# Patient Record
Sex: Female | Born: 1960 | Race: White | Hispanic: No | Marital: Married | Smoking: Former smoker
Health system: Southern US, Community
[De-identification: ages and names within clinical notes are randomized; demographics above are authoritative.]

## PROBLEM LIST (undated history)

## (undated) DIAGNOSIS — K5790 Diverticulosis of intestine, part unspecified, without perforation or abscess without bleeding: Secondary | ICD-10-CM

## (undated) DIAGNOSIS — I10 Essential (primary) hypertension: Secondary | ICD-10-CM

## (undated) DIAGNOSIS — C801 Malignant (primary) neoplasm, unspecified: Secondary | ICD-10-CM

## (undated) DIAGNOSIS — I714 Abdominal aortic aneurysm, without rupture, unspecified: Secondary | ICD-10-CM

## (undated) DIAGNOSIS — E785 Hyperlipidemia, unspecified: Secondary | ICD-10-CM

## (undated) DIAGNOSIS — M1990 Unspecified osteoarthritis, unspecified site: Secondary | ICD-10-CM

## (undated) DIAGNOSIS — R112 Nausea with vomiting, unspecified: Secondary | ICD-10-CM

## (undated) HISTORY — PX: APPENDECTOMY: SHX54

## (undated) HISTORY — DX: Abdominal aortic aneurysm, without rupture, unspecified: I71.40

## (undated) HISTORY — DX: Unspecified osteoarthritis, unspecified site: M19.90

## (undated) HISTORY — DX: Essential (primary) hypertension: I10

## (undated) HISTORY — DX: Hyperlipidemia, unspecified: E78.5

## (undated) HISTORY — PX: TONSILLECTOMY: SHX5217

## (undated) HISTORY — DX: Diverticulosis of intestine, part unspecified, without perforation or abscess without bleeding: K57.90

## (undated) HISTORY — PX: NECK SURGERY: SHX720

## (undated) HISTORY — PX: MOHS SURGERY: SHX181

---

## 1993-06-08 HISTORY — PX: ECTOPIC PREGNANCY SURGERY: SHX613

## 2005-04-03 ENCOUNTER — Ambulatory Visit: Payer: Self-pay | Admitting: Family Medicine

## 2005-08-18 ENCOUNTER — Observation Stay (HOSPITAL_COMMUNITY): Admission: RE | Admit: 2005-08-18 | Discharge: 2005-08-19 | Payer: Self-pay | Admitting: Neurosurgery

## 2006-04-22 ENCOUNTER — Other Ambulatory Visit: Payer: Self-pay

## 2006-04-22 ENCOUNTER — Emergency Department: Payer: Self-pay | Admitting: Emergency Medicine

## 2006-05-06 ENCOUNTER — Ambulatory Visit: Payer: Self-pay | Admitting: Cardiovascular Disease

## 2006-05-20 DIAGNOSIS — I10 Essential (primary) hypertension: Secondary | ICD-10-CM | POA: Insufficient documentation

## 2006-05-24 ENCOUNTER — Ambulatory Visit: Payer: Self-pay | Admitting: Gastroenterology

## 2006-06-12 ENCOUNTER — Ambulatory Visit: Payer: Self-pay | Admitting: Gastroenterology

## 2006-07-22 ENCOUNTER — Ambulatory Visit: Payer: Self-pay | Admitting: Gastroenterology

## 2007-02-15 ENCOUNTER — Ambulatory Visit: Payer: Self-pay | Admitting: Family Medicine

## 2007-02-16 ENCOUNTER — Ambulatory Visit: Payer: Self-pay | Admitting: Gastroenterology

## 2007-11-04 ENCOUNTER — Ambulatory Visit: Payer: Self-pay | Admitting: Gastroenterology

## 2008-04-28 ENCOUNTER — Ambulatory Visit: Payer: Self-pay | Admitting: Family Medicine

## 2009-02-06 ENCOUNTER — Ambulatory Visit: Payer: Self-pay | Admitting: Internal Medicine

## 2009-07-31 ENCOUNTER — Ambulatory Visit: Payer: Self-pay | Admitting: Family Medicine

## 2010-05-02 ENCOUNTER — Ambulatory Visit: Payer: Self-pay | Admitting: Internal Medicine

## 2010-07-16 ENCOUNTER — Ambulatory Visit: Payer: Self-pay | Admitting: Family Medicine

## 2011-04-15 DIAGNOSIS — D1800 Hemangioma unspecified site: Secondary | ICD-10-CM | POA: Insufficient documentation

## 2011-06-05 ENCOUNTER — Ambulatory Visit: Payer: Self-pay | Admitting: Internal Medicine

## 2011-06-19 ENCOUNTER — Ambulatory Visit: Payer: Self-pay | Admitting: Otolaryngology

## 2012-03-28 ENCOUNTER — Ambulatory Visit: Payer: Self-pay | Admitting: Internal Medicine

## 2012-04-11 ENCOUNTER — Other Ambulatory Visit: Payer: Self-pay | Admitting: Internal Medicine

## 2012-04-13 ENCOUNTER — Ambulatory Visit: Payer: Self-pay | Admitting: Internal Medicine

## 2012-11-14 ENCOUNTER — Ambulatory Visit: Payer: Self-pay | Admitting: Internal Medicine

## 2012-11-22 ENCOUNTER — Ambulatory Visit: Payer: Self-pay | Admitting: Orthopedic Surgery

## 2012-11-24 ENCOUNTER — Ambulatory Visit: Payer: Self-pay | Admitting: Orthopedic Surgery

## 2012-11-24 LAB — APTT: Activated PTT: 48.9 secs — ABNORMAL HIGH (ref 23.6–35.9)

## 2012-11-24 LAB — PLATELET COUNT: Platelet: 211 10*3/uL (ref 150–440)

## 2012-12-12 ENCOUNTER — Ambulatory Visit: Payer: Self-pay | Admitting: Orthopedic Surgery

## 2012-12-12 LAB — PLATELET COUNT: Platelet: 216 10*3/uL (ref 150–440)

## 2012-12-12 LAB — PROTIME-INR: Prothrombin Time: 12.8 secs (ref 11.5–14.7)

## 2013-01-10 ENCOUNTER — Encounter: Payer: Self-pay | Admitting: Orthopedic Surgery

## 2013-02-06 ENCOUNTER — Encounter: Payer: Self-pay | Admitting: Orthopedic Surgery

## 2013-05-16 ENCOUNTER — Ambulatory Visit: Payer: Self-pay | Admitting: Internal Medicine

## 2014-12-27 ENCOUNTER — Other Ambulatory Visit: Payer: Self-pay | Admitting: Family Medicine

## 2015-02-14 ENCOUNTER — Other Ambulatory Visit: Payer: Self-pay | Admitting: Family Medicine

## 2015-02-14 NOTE — Telephone Encounter (Signed)
Pt needs check further refills 

## 2015-02-25 NOTE — Telephone Encounter (Signed)
Called home # has been disconnected, called work # left voicemail to return call and schedule an appointment. Thanks.

## 2015-03-21 ENCOUNTER — Other Ambulatory Visit: Payer: Self-pay | Admitting: Family Medicine

## 2015-04-28 ENCOUNTER — Other Ambulatory Visit: Payer: Self-pay | Admitting: Family Medicine

## 2015-04-29 NOTE — Telephone Encounter (Signed)
Apt med  check

## 2015-05-06 NOTE — Telephone Encounter (Signed)
Lm for pt to call and schedule appt.

## 2015-05-06 NOTE — Telephone Encounter (Signed)
Appt 05/15/15

## 2015-05-15 ENCOUNTER — Ambulatory Visit: Payer: Self-pay | Admitting: Family Medicine

## 2015-06-22 ENCOUNTER — Other Ambulatory Visit: Payer: Self-pay | Admitting: Family Medicine

## 2015-06-22 NOTE — Telephone Encounter (Signed)
Old chart reviewed; she's been on this since Sept 2015; refill approved

## 2015-08-21 ENCOUNTER — Other Ambulatory Visit: Payer: Self-pay | Admitting: Family Medicine

## 2015-09-19 ENCOUNTER — Other Ambulatory Visit: Payer: Self-pay | Admitting: Family Medicine

## 2015-09-19 ENCOUNTER — Encounter: Payer: Self-pay | Admitting: Family Medicine

## 2015-09-19 NOTE — Telephone Encounter (Signed)
Apt pe 

## 2015-09-19 NOTE — Telephone Encounter (Signed)
Letter sent.

## 2015-10-20 ENCOUNTER — Other Ambulatory Visit: Payer: Self-pay | Admitting: Family Medicine

## 2016-01-14 ENCOUNTER — Other Ambulatory Visit: Payer: Self-pay | Admitting: Family Medicine

## 2016-06-16 ENCOUNTER — Other Ambulatory Visit: Payer: Self-pay

## 2016-06-16 MED ORDER — MELOXICAM 15 MG PO TABS: ORAL_TABLET | ORAL | 1 refills | Status: DC

## 2016-06-16 NOTE — Telephone Encounter (Signed)
Pharmacy requests 90 day supply

## 2016-12-12 ENCOUNTER — Other Ambulatory Visit: Payer: Self-pay | Admitting: Family Medicine

## 2018-06-15 ENCOUNTER — Other Ambulatory Visit: Payer: Self-pay | Admitting: Family Medicine

## 2018-06-15 DIAGNOSIS — Z1231 Encounter for screening mammogram for malignant neoplasm of breast: Secondary | ICD-10-CM

## 2018-06-16 ENCOUNTER — Other Ambulatory Visit: Payer: Self-pay

## 2018-06-16 DIAGNOSIS — Z1211 Encounter for screening for malignant neoplasm of colon: Secondary | ICD-10-CM

## 2018-06-20 ENCOUNTER — Ambulatory Visit
Admission: RE | Admit: 2018-06-20 | Discharge: 2018-06-20 | Disposition: A | Discharge status: Home / Self Care | Payer: 59 | Source: Ambulatory Visit | Attending: Family Medicine | Admitting: Family Medicine

## 2018-06-20 ENCOUNTER — Encounter: Payer: Self-pay | Admitting: Radiology

## 2018-06-20 DIAGNOSIS — Z1231 Encounter for screening mammogram for malignant neoplasm of breast: Secondary | ICD-10-CM | POA: Diagnosis not present

## 2018-06-20 HISTORY — DX: Malignant (primary) neoplasm, unspecified: C80.1

## 2018-06-24 ENCOUNTER — Telehealth: Payer: Self-pay | Admitting: Gastroenterology

## 2018-06-24 NOTE — Telephone Encounter (Signed)
Patient contacted office stated its too soon for her to have her colonoscopy.  She said she will contact her PCP to let her know.  Referral has been canceled and procedure canceled with Trish in Endoscopy.  Thanks [employer]

## 2018-06-24 NOTE — Telephone Encounter (Signed)
Patient called & would like to cancel her colonoscopy scheduled for 07-26-2018 with Dr Allen Norris. Her primary care referred her. She had her last colonoscopy done by Dr Allen Norris on 06-27-10. She does not think she needs one this soon. She will also tell her PCP.

## 2018-07-26 ENCOUNTER — Ambulatory Visit: Admit: 2018-07-26 | Payer: Self-pay | Admitting: Gastroenterology

## 2018-07-26 SURGERY — COLONOSCOPY WITH PROPOFOL
Anesthesia: General

## 2019-10-07 DEATH — deceased

## 2020-09-13 DIAGNOSIS — L570 Actinic keratosis: Secondary | ICD-10-CM | POA: Insufficient documentation

## 2020-09-13 DIAGNOSIS — R3129 Other microscopic hematuria: Secondary | ICD-10-CM | POA: Insufficient documentation

## 2020-10-16 DIAGNOSIS — E785 Hyperlipidemia, unspecified: Secondary | ICD-10-CM | POA: Insufficient documentation

## 2020-10-16 DIAGNOSIS — M948X9 Other specified disorders of cartilage, unspecified sites: Secondary | ICD-10-CM | POA: Insufficient documentation

## 2020-10-16 DIAGNOSIS — M898X9 Other specified disorders of bone, unspecified site: Secondary | ICD-10-CM | POA: Insufficient documentation

## 2021-08-08 DIAGNOSIS — E039 Hypothyroidism, unspecified: Secondary | ICD-10-CM | POA: Diagnosis not present

## 2021-08-08 DIAGNOSIS — Z1211 Encounter for screening for malignant neoplasm of colon: Secondary | ICD-10-CM | POA: Diagnosis not present

## 2021-08-08 DIAGNOSIS — R7303 Prediabetes: Secondary | ICD-10-CM | POA: Diagnosis not present

## 2021-08-08 DIAGNOSIS — D519 Vitamin B12 deficiency anemia, unspecified: Secondary | ICD-10-CM | POA: Diagnosis not present

## 2021-08-08 DIAGNOSIS — Z1231 Encounter for screening mammogram for malignant neoplasm of breast: Secondary | ICD-10-CM | POA: Diagnosis not present

## 2021-08-08 DIAGNOSIS — E782 Mixed hyperlipidemia: Secondary | ICD-10-CM | POA: Diagnosis not present

## 2021-08-08 DIAGNOSIS — I10 Essential (primary) hypertension: Secondary | ICD-10-CM | POA: Diagnosis not present

## 2021-08-08 DIAGNOSIS — E559 Vitamin D deficiency, unspecified: Secondary | ICD-10-CM | POA: Diagnosis not present

## 2021-08-09 DIAGNOSIS — H43819 Vitreous degeneration, unspecified eye: Secondary | ICD-10-CM | POA: Insufficient documentation

## 2021-08-11 ENCOUNTER — Other Ambulatory Visit: Payer: Self-pay | Admitting: Family

## 2021-08-11 DIAGNOSIS — E782 Mixed hyperlipidemia: Secondary | ICD-10-CM | POA: Diagnosis not present

## 2021-08-11 DIAGNOSIS — E039 Hypothyroidism, unspecified: Secondary | ICD-10-CM | POA: Diagnosis not present

## 2021-08-11 DIAGNOSIS — R7303 Prediabetes: Secondary | ICD-10-CM | POA: Diagnosis not present

## 2021-08-11 DIAGNOSIS — E559 Vitamin D deficiency, unspecified: Secondary | ICD-10-CM | POA: Diagnosis not present

## 2021-08-11 DIAGNOSIS — I10 Essential (primary) hypertension: Secondary | ICD-10-CM | POA: Diagnosis not present

## 2021-08-11 DIAGNOSIS — D519 Vitamin B12 deficiency anemia, unspecified: Secondary | ICD-10-CM | POA: Diagnosis not present

## 2021-08-11 DIAGNOSIS — Z1231 Encounter for screening mammogram for malignant neoplasm of breast: Secondary | ICD-10-CM

## 2021-08-13 ENCOUNTER — Other Ambulatory Visit: Payer: Self-pay

## 2021-08-13 DIAGNOSIS — Z1211 Encounter for screening for malignant neoplasm of colon: Secondary | ICD-10-CM

## 2021-08-13 MED ORDER — CLENPIQ 10-3.5-12 MG-GM -GM/160ML PO SOLN: 1.0000 | Freq: Once | ORAL | 0 refills | Status: AC

## 2021-08-13 NOTE — Progress Notes (Unsigned)
0

## 2021-08-13 NOTE — Progress Notes (Signed)
Gastroenterology Pre-Procedure Review ? ?Request Date: 09/01/2021 ?Requesting Physician: Dr. Marius Ditch ? ?PATIENT REVIEW QUESTIONS: The patient responded to the following health history questions as indicated:   ? ?1. Are you having any GI issues? no ?2. Do you have a personal history of Polyps? no ?3. Do you have a family history of Colon Cancer or Polyps? no ?4. Diabetes Mellitus? no ?5. Joint replacements in the past 12 months?no ?6. Major health problems in the past 3 months?no ?7. Any artificial heart valves, MVP, or defibrillator?no ?   ?MEDICATIONS & ALLERGIES:    ?Patient reports the following regarding taking any anticoagulation/antiplatelet therapy:   ?Plavix, Coumadin, Eliquis, Xarelto, Lovenox, Pradaxa, Brilinta, or Effient? no ?Aspirin? no ? ?Patient confirms/reports the following medications:  ?Current Outpatient Medications  ?Medication Sig Dispense Refill  ? amLODipine (NORVASC) 5 MG tablet TAKE 1 TABLET BY MOUTH DAILY 90 tablet 1  ? benazepril (LOTENSIN) 40 MG tablet TAKE 1 TABLET BY MOUTH DAILY 90 tablet 1  ? cloNIDine (CATAPRES) 0.1 MG tablet Take 1 tablet (0.1 mg total) by mouth 2 (two) times daily. 180 tablet 0  ? hydrochlorothiazide (HYDRODIURIL) 25 MG tablet TAKE 1 TABLET BY MOUTH DAILY 90 tablet 1  ? meloxicam (MOBIC) 15 MG tablet TAKE 1 TABLET BY MOUTH EVERY DAY AS NEEDED FOR PAIN 90 tablet 1  ? metoprolol succinate (TOPROL-XL) 50 MG 24 hr tablet TAKE 1 TABLET BY MOUTH DAILY 90 tablet 0  ? potassium chloride (MICRO-K) 10 MEQ CR capsule TAKE ONE CAPSULE BY MOUTH DAILY 5 capsule 0  ? ?No current facility-administered medications for this visit.  ? ? ?Patient confirms/reports the following allergies:  ?Allergies  ?Allergen Reactions  ? Penicillins   ? Prochlorperazine   ? ? ?No orders of the defined types were placed in this encounter. ? ? ?AUTHORIZATION INFORMATION ?Primary Insurance: ?1D#: ?Group #: ? ?Secondary Insurance: ?1D#: ?Group #: ? ?SCHEDULE INFORMATION: ?Date:  09/01/2021 ?Time: ?Location: [facility] ? ?

## 2021-09-01 ENCOUNTER — Ambulatory Visit: Payer: 59 | Admitting: Certified Registered Nurse Anesthetist

## 2021-09-01 ENCOUNTER — Ambulatory Visit
Admission: RE | Admit: 2021-09-01 | Discharge: 2021-09-01 | Disposition: A | Discharge status: Home / Self Care | Payer: 59 | Attending: Gastroenterology | Admitting: Gastroenterology

## 2021-09-01 ENCOUNTER — Encounter: Payer: Self-pay | Admitting: Gastroenterology

## 2021-09-01 ENCOUNTER — Encounter
Admission: RE | Disposition: A | Discharge status: Home / Self Care | Payer: Self-pay | Source: Home / Self Care | Attending: Gastroenterology

## 2021-09-01 DIAGNOSIS — K5730 Diverticulosis of large intestine without perforation or abscess without bleeding: Secondary | ICD-10-CM | POA: Diagnosis not present

## 2021-09-01 DIAGNOSIS — D123 Benign neoplasm of transverse colon: Secondary | ICD-10-CM | POA: Insufficient documentation

## 2021-09-01 DIAGNOSIS — D128 Benign neoplasm of rectum: Secondary | ICD-10-CM | POA: Diagnosis not present

## 2021-09-01 DIAGNOSIS — K649 Unspecified hemorrhoids: Secondary | ICD-10-CM | POA: Diagnosis not present

## 2021-09-01 DIAGNOSIS — K621 Rectal polyp: Secondary | ICD-10-CM | POA: Diagnosis not present

## 2021-09-01 DIAGNOSIS — Z1211 Encounter for screening for malignant neoplasm of colon: Secondary | ICD-10-CM | POA: Diagnosis not present

## 2021-09-01 DIAGNOSIS — K644 Residual hemorrhoidal skin tags: Secondary | ICD-10-CM | POA: Insufficient documentation

## 2021-09-01 DIAGNOSIS — D126 Benign neoplasm of colon, unspecified: Secondary | ICD-10-CM | POA: Diagnosis not present

## 2021-09-01 DIAGNOSIS — K635 Polyp of colon: Secondary | ICD-10-CM

## 2021-09-01 DIAGNOSIS — D120 Benign neoplasm of cecum: Secondary | ICD-10-CM | POA: Diagnosis not present

## 2021-09-01 HISTORY — PX: COLONOSCOPY WITH PROPOFOL: SHX5780

## 2021-09-01 SURGERY — COLONOSCOPY WITH PROPOFOL
Anesthesia: General

## 2021-09-01 MED ORDER — PROPOFOL 10 MG/ML IV BOLUS
INTRAVENOUS | Status: DC | PRN
  Administered 2021-09-01: 80 mg via INTRAVENOUS

## 2021-09-01 MED ORDER — PROPOFOL 500 MG/50ML IV EMUL
INTRAVENOUS | Status: AC
  Filled 2021-09-01: qty 50

## 2021-09-01 MED ORDER — SODIUM CHLORIDE 0.9 % IV SOLN
INTRAVENOUS | Status: DC
  Administered 2021-09-01: 20 mL/h via INTRAVENOUS

## 2021-09-01 MED ORDER — PROPOFOL 500 MG/50ML IV EMUL
INTRAVENOUS | Status: DC | PRN
Start: 2021-09-01 — End: 2021-09-01
  Administered 2021-09-01: 150 ug/kg/min via INTRAVENOUS

## 2021-09-01 MED ORDER — LIDOCAINE HCL (PF) 2 % IJ SOLN
INTRAMUSCULAR | Status: AC
  Filled 2021-09-01: qty 5

## 2021-09-01 MED ORDER — SODIUM CHLORIDE 0.9 % IV SOLN
INTRAVENOUS | Status: AC | PRN
Start: 2021-09-01 — End: 2021-09-01
  Administered 2021-09-01: 3 mL via INTRAMUSCULAR

## 2021-09-01 MED ORDER — GLYCOPYRROLATE 0.2 MG/ML IJ SOLN
INTRAMUSCULAR | Status: AC
  Filled 2021-09-01: qty 1

## 2021-09-01 MED ORDER — LIDOCAINE HCL (CARDIAC) PF 100 MG/5ML IV SOSY
PREFILLED_SYRINGE | INTRAVENOUS | Status: DC | PRN
  Administered 2021-09-01: 50 mg via INTRAVENOUS

## 2021-09-01 NOTE — Anesthesia Postprocedure Evaluation (Signed)
Anesthesia Post Note ? ?Patient: Laura Mcdowell ? ?Procedure(s) Performed: COLONOSCOPY WITH PROPOFOL ? ?Patient location during evaluation: Endoscopy ?Anesthesia Type: General ?Level of consciousness: awake and alert ?Pain management: pain level controlled ?Vital Signs Assessment: post-procedure vital signs reviewed and stable ?Respiratory status: spontaneous breathing, nonlabored ventilation, respiratory function stable and patient connected to nasal cannula oxygen ?Cardiovascular status: blood pressure returned to baseline and stable ?Postop Assessment: no apparent nausea or vomiting ?Anesthetic complications: no ? ? ?No notable events documented. ? ? ?Last Vitals:  ?Vitals:  ? 09/01/21 0901 09/01/21 0910  ?BP: (!) 166/99 (!) 163/95  ?Pulse: 68 65  ?Resp: 12 12  ?Temp:    ?SpO2: 99% 96%  ?  ?Last Pain:  ?Vitals:  ? 09/01/21 0910  ?TempSrc:   ?PainSc: 0-No pain  ? ? ?  ?  ?  ?  ?  ?  ? ?Martha Clan ? ? ? ? ?

## 2021-09-01 NOTE — Transfer of Care (Signed)
Immediate Anesthesia Transfer of Care Note ? ?Patient: Laura Mcdowell ? ?Procedure(s) Performed: COLONOSCOPY WITH PROPOFOL ? ?Patient Location: PACU ? ?Anesthesia Type:General ? ?Level of Consciousness: awake, alert  and oriented ? ?Airway & Oxygen Therapy: Patient Spontanous Breathing ? ?Post-op Assessment: Report given to RN and Post -op Vital signs reviewed and stable ? ?Post vital signs: Reviewed and stable ? ?Last Vitals:  ?Vitals Value Taken Time  ?BP 129/83 09/01/21 0842  ?Temp    ?Pulse 79 09/01/21 0842  ?Resp 19 09/01/21 0842  ?SpO2 100 % 09/01/21 0842  ?Vitals shown include unvalidated device data. ? ?Last Pain:  ?Vitals:  ? 09/01/21 0646  ?TempSrc: Temporal  ?PainSc: 0-No pain  ?   ? ?  ? ?Complications: No notable events documented. ?

## 2021-09-01 NOTE — Anesthesia Procedure Notes (Signed)
Date/Time: 09/01/2021 7:57 AM ?Performed by: Johnna Acosta, CRNA ?Pre-anesthesia Checklist: Patient identified, Emergency Drugs available, Patient being monitored, Suction available and Timeout performed ?Patient Re-evaluated:Patient Re-evaluated prior to induction ?Oxygen Delivery Method: Nasal cannula ?Preoxygenation: Pre-oxygenation with 100% oxygen ?Induction Type: IV induction ? ? ? ? ?

## 2021-09-01 NOTE — Op Note (Signed)
[facility] ?Gastroenterology ?Patient Name: Laura Mcdowell ?Procedure Date: 09/01/2021 7:39 AM ?MRN: [id number] ?Account #: [id number] ?Date of Birth: 23-Oct-1960 ?Admit Type: Outpatient ?Age: 61 ?Room: [facility] ENDO ROOM 1 ?Gender: Female ?Note Status: Finalized ?Instrument Name: Peds Colonoscope [id number] ?Procedure:             Colonoscopy ?Indications:           Screening for colorectal malignant neoplasm, Last  ?                       colonoscopy 10 years ago ?Providers:             Lin Landsman MD, MD ?Referring MD:          [facility] ?Medicines:             General Anesthesia ?Complications:         No immediate complications. Estimated blood loss: None. ?Procedure:             Pre-Anesthesia Assessment: ?                       - Prior to the procedure, a History and Physical was  ?                       performed, and patient medications and allergies were  ?                       reviewed. The patient is competent. The risks and  ?                       benefits of the procedure and the sedation options and  ?                       risks were discussed with the patient. All questions  ?                       were answered and informed consent was obtained.  ?                       Patient identification and proposed procedure were  ?                       verified by the physician, the nurse, the  ?                       anesthesiologist, the anesthetist and the technician  ?                       in the pre-procedure area in the procedure room in the  ?                       endoscopy suite. Mental Status Examination: alert and  ?                       oriented. Airway Examination: normal oropharyngeal  ?                       airway and neck mobility. Respiratory Examination:  ?  clear to auscultation. CV Examination: normal.  ?                       Prophylactic Antibiotics: The patient does not require  ?                       prophylactic  antibiotics. Prior Anticoagulants: The  ?                       patient has taken no previous anticoagulant or  ?                       antiplatelet agents. ASA Grade Assessment: II - A  ?                       patient with mild systemic disease. After reviewing  ?                       the risks and benefits, the patient was deemed in  ?                       satisfactory condition to undergo the procedure. The  ?                       anesthesia plan was to use general anesthesia.  ?                       Immediately prior to administration of medications,  ?                       the patient was re-assessed for adequacy to receive  ?                       sedatives. The heart rate, respiratory rate, oxygen  ?                       saturations, blood pressure, adequacy of pulmonary  ?                       ventilation, and response to care were monitored  ?                       throughout the procedure. The physical status of the  ?                       patient was re-assessed after the procedure. ?                       After obtaining informed consent, the colonoscope was  ?                       passed under direct vision. Throughout the procedure,  ?                       the patient's blood pressure, pulse, and oxygen  ?                       saturations were monitored continuously. The  ?  Colonoscope was introduced through the anus and  ?                       advanced to the the terminal ileum, with  ?                       identification of the appendiceal orifice and IC  ?                       valve. The colonoscopy was performed without  ?                       difficulty. The patient tolerated the procedure well.  ?                       The quality of the bowel preparation was evaluated  ?                       using the BBPS [facility] Bowel Preparation Scale) with  ?                       scores of: Right Colon = 3, Transverse Colon = 3 and  ?                       Left Colon =  3 (entire mucosa seen well with no  ?                       residual staining, small fragments of stool or opaque  ?                       liquid). The total BBPS score equals 9. ?Findings: ?     The perianal and digital rectal examinations were normal. Pertinent  ?     negatives include normal sphincter tone and no palpable rectal lesions. ?     The terminal ileum appeared normal. ?     A 10 mm polyp was found in the cecum. The polyp was flat. Preparations  ?     were made for mucosal resection. NBI was done to mark the borders of the  ?     lesion. Saline was injected to raise the lesion. Snare mucosal resection  ?     was performed. Resection and retrieval were complete. To prevent  ?     bleeding after mucosal resection, one hemostatic clip was successfully  ?     placed (MR conditional). There was no bleeding during, or at the end, of  ?     the procedure. ?     A 9 mm polyp was found in the transverse colon. The polyp was sessile.  ?     The polyp was removed with a hot snare. Resection and retrieval were  ?     complete. ?     A diminutive polyp was found in the rectum ileocecal valve. The polyp  ?     was sessile. The polyp was removed with a cold biopsy forceps. Resection  ?     and retrieval were complete. ?     Multiple diverticula were found in the entire colon. ?     Non-bleeding external hemorrhoids were found during retroflexion. The  ?  hemorrhoids were small. ?Impression:            - The examined portion of the ileum was normal. ?                       - One 10 mm polyp in the cecum, removed with mucosal  ?                       resection. Resected and retrieved. Clip (MR  ?                       conditional) was placed. ?                       - One 9 mm polyp in the transverse colon, removed with  ?                       a hot snare. Resected and retrieved. ?                       - One diminutive polyp in the rectum at the ileocecal  ?                       valve, removed with a cold biopsy  forceps. Resected  ?                       and retrieved. ?                       - Diverticulosis in the entire examined colon. ?                       - Non-bleeding external hemorrhoids. ?                       - Mucosal resection was performed. Resection and  ?                       retrieval were complete. ?Recommendation:        - Discharge patient to home (with spouse). ?                       - Resume previous diet today. ?                       - Continue present medications. ?                       - Await pathology results. ?                       - Repeat colonoscopy in 3 - 5 years for surveillance  ?                       of multiple polyps. ?Procedure Code(s):     --- Professional --- ?                       [phone], Colonoscopy, flexible; with endoscopic mucosal  ?  resection ?                       62836, 59, Colonoscopy, flexible; with removal of  ?                       tumor(s), polyp(s), or other lesion(s) by snare  ?                       technique ?                       45380, 59, Colonoscopy, flexible; with biopsy, single  ?                       or multiple ?Diagnosis Code(s):     --- Professional --- ?                       Z12.11, Encounter for screening for malignant neoplasm  ?                       of colon ?                       K62.1, Rectal polyp ?                       K64.4, Residual hemorrhoidal skin tags ?                       K63.5, Polyp of colon ?                       K57.30, Diverticulosis of large intestine without  ?                       perforation or abscess without bleeding ?CPT copyright 2019 American Medical Association. All rights reserved. ?The codes documented in this report are preliminary and upon coder review may  ?be revised to meet current compliance requirements. ?Dr. Ulyess Mort ?Raywood Wailes Raeanne Gathers MD, MD ?09/01/2021 8:39:17 AM ?This report has been signed electronically. ?Number of Addenda: 0 ?Note Initiated On: 09/01/2021 7:39  AM ?Scope Withdrawal Time: 0 hours 32 minutes 54 seconds  ?Total Procedure Duration: 0 hours 39 minutes 44 seconds  ?Estimated Blood Loss:  Estimated blood loss: none. ?     [facility] ?

## 2021-09-01 NOTE — H&P (Signed)
?Cephas Darby, MD ?[address]  ?[address]  ?[place], [place] [address]  ?Main: [phone]  ?Fax: [phone] ?Pager: [phone] ? ?Primary Care Physician:  [facility] ?Primary Gastroenterologist:  Dr. Cephas Darby ? ?Pre-Procedure History & Physical: ?HPI:  Laura Mcdowell is a 61 y.o. female is here for an colonoscopy. ?  ?Past Medical History:  ?Diagnosis Date  ? 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 ? ?History reviewed. No pertinent surgical history. ? ?Prior to Admission medications   ?Medication Sig Start Date End Date Taking? Authorizing Provider  ?amLODipine (NORVASC) 5 MG tablet TAKE 1 TABLET BY MOUTH DAILY 09/19/15  Yes Crissman, Jeannette How, MD  ?cloNIDine (CATAPRES) 0.1 MG tablet Take 1 tablet (0.1 mg total) by mouth 2 (two) times daily. 06/22/15  Yes Lada, Satira Anis, MD  ?hydrochlorothiazide (HYDRODIURIL) 25 MG tablet TAKE 1 TABLET BY MOUTH DAILY 09/19/15  Yes Crissman, Jeannette How, MD  ?meloxicam (MOBIC) 15 MG tablet TAKE 1 TABLET BY MOUTH EVERY DAY AS NEEDED FOR PAIN 06/16/16  Yes Johnson, Megan P, DO  ?metoprolol succinate (TOPROL-XL) 50 MG 24 hr tablet TAKE 1 TABLET BY MOUTH DAILY 09/19/15  Yes Crissman, Jeannette How, MD  ?potassium chloride (MICRO-K) 10 MEQ CR capsule TAKE ONE CAPSULE BY MOUTH DAILY 10/21/15  Yes Crissman, Jeannette How, MD  ?benazepril (LOTENSIN) 40 MG tablet TAKE 1 TABLET BY MOUTH DAILY 09/19/15   Guadalupe Maple, MD  ? ? ?Allergies as of 08/13/2021 - never reviewed  ?Allergen Reaction Noted  ? Penicillins  05/20/2006  ? Prochlorperazine  04/15/2011  ? ? ?History reviewed. No pertinent family history. ? ?Social History  ? ?Socioeconomic History  ? Marital status: Married  ?  Spouse name: Not on file  ? Number of children: Not on file  ? Years of education: Not on file  ? Highest education level: Not on file  ?Occupational History  ? Not on file  ?Tobacco Use  ? Smoking status: Not on file  ? Smokeless tobacco: Not on file  ?Substance and Sexual Activity  ? Alcohol use: Not on file  ? Drug  use: Not on file  ? Sexual activity: Not on file  ?Other Topics Concern  ? Not on file  ?Social History Narrative  ? Not on file  ? ?Social Determinants of Health  ? ?Financial Resource Strain: Not on file  ?Food Insecurity: Not on file  ?Transportation Needs: Not on file  ?Physical Activity: Not on file  ?Stress: Not on file  ?Social Connections: Not on file  ?Intimate Partner Violence: Not on file  ? ? ?Review of Systems: ?See HPI, otherwise negative ROS ? ?Physical Exam: ?BP (!) 197/116   Pulse 76   Temp (!) 97 ?F (36.1 ?C) (Temporal)   Resp 20   Ht '5\' 7"'$  (1.702 m)   Wt 79.4 kg   SpO2 97%   BMI 27.41 kg/m?  ?General:   Alert,  pleasant and cooperative in NAD ?Head:  Normocephalic and atraumatic. ?Neck:  Supple; no masses or thyromegaly. ?Lungs:  Clear throughout to auscultation.    ?Heart:  Regular rate and rhythm. ?Abdomen:  Soft, nontender and nondistended. Normal bowel sounds, without guarding, and without rebound.   ?Neurologic:  Alert and  oriented x4;  grossly normal neurologically. ? ?Impression/Plan: ?Laura Mcdowell is here for an colonoscopy to be performed for colon cancer screening ? ?Risks, benefits, limitations, and alternatives regarding  colonoscopy have been reviewed with the patient.  Questions have  been answered.  All parties agreeable. ? ? ?Sherri Sear, MD  09/01/2021, 7:40 AM ?

## 2021-09-01 NOTE — Anesthesia Preprocedure Evaluation (Signed)
Anesthesia Evaluation  ?Patient identified by MRN, date of birth, ID band ?Patient awake ? ? ? ?Reviewed: ?Allergy & Precautions, H&P , NPO status , Patient's Chart, lab work & pertinent test results, reviewed documented beta blocker date and time  ? ?History of Anesthesia Complications ?(+) PONV and history of anesthetic complications ? ?Airway ?Mallampati: II ? ?TM Distance: >3 FB ?Neck ROM: full ? ? ? Dental ?no notable dental hx. ? ?  ?Pulmonary ?neg pulmonary ROS,  ?  ?Pulmonary exam normal ?breath sounds clear to auscultation ? ? ? ? ? ? Cardiovascular ?Exercise Tolerance: Good ?hypertension (h/o, but none since retiring), (-) angina(-) Past MI and (-) Cardiac Stents Normal cardiovascular exam(-) dysrhythmias (-) Valvular Problems/Murmurs ?Rhythm:regular Rate:Normal ? ? ?  ?Neuro/Psych ?negative neurological ROS ? negative psych ROS  ? GI/Hepatic ?negative GI ROS, Neg liver ROS,   ?Endo/Other  ?negative endocrine ROS ? Renal/GU ?negative Renal ROS  ?negative genitourinary ?  ?Musculoskeletal ? ? Abdominal ?  ?Peds ? Hematology ?negative hematology ROS ?(+)   ?Anesthesia Other Findings ?Past Medical History: ?No date: Cancer [facility]) ?    Comment:  skin ? ? Reproductive/Obstetrics ?negative OB ROS ? ?  ? ? ? ? ? ? ? ? ? ? ? ? ? ?  ?  ? ? ? ? ? ? ? ? ?Anesthesia Physical ?Anesthesia Plan ? ?ASA: 2 ? ?Anesthesia Plan: General  ? ?Post-op Pain Management:   ? ?Induction: Intravenous ? ?PONV Risk Score and Plan: 4 or greater and Propofol infusion and TIVA ? ?Airway Management Planned: Natural Airway and Nasal Cannula ? ?Additional Equipment:  ? ?Intra-op Plan:  ? ?Post-operative Plan:  ? ?Informed Consent: I have reviewed the patients History and Physical, chart, labs and discussed the procedure including the risks, benefits and alternatives for the proposed anesthesia with the patient or authorized representative who has indicated his/her understanding and acceptance.  ? ? ? ?Dental  Advisory Given ? ?Plan Discussed with: Anesthesiologist, CRNA and Surgeon ? ?Anesthesia Plan Comments:   ? ? ? ? ? ? ?Anesthesia Quick Evaluation ? ?

## 2021-09-02 ENCOUNTER — Encounter: Payer: Self-pay | Admitting: Gastroenterology

## 2021-09-02 LAB — SURGICAL PATHOLOGY

## 2021-09-03 ENCOUNTER — Telehealth: Payer: 59 | Admitting: Gastroenterology

## 2021-09-03 ENCOUNTER — Encounter: Payer: Self-pay | Admitting: Gastroenterology

## 2021-09-03 ENCOUNTER — Telehealth: Payer: Self-pay | Admitting: Gastroenterology

## 2021-09-03 NOTE — Telephone Encounter (Signed)
Pt left message to please call her back she was on another call ?

## 2021-09-03 NOTE — Telephone Encounter (Signed)
Please have her stay on liquid diet rest of the day and let's check CBC ? ?RV ?

## 2021-09-03 NOTE — Telephone Encounter (Signed)
Called patient regarding rectal bleeding.  She reports that she passed about a spoonful of bright red blood with small clot.  She also reports mild left upper quadrant tenderness and has applied heating pad.  She resumed regular meal with chicken sandwich and grilled chicken.  Advised patient to stay on liquid diet rest of the day, take Tylenol as needed for pain.  We will touch base with her tomorrow morning about her symptoms and check CBC if needed.  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 ?Advised patient to go to the emergency room if her symptoms are worsening ? ?Cephas Darby, MD ?[facility], [place] ?[address]  ?[address]  ?[place], [place] [address]  ?Main: [phone]  ?Fax: [phone] ?Pager: [phone] ? ?

## 2021-09-03 NOTE — Telephone Encounter (Signed)
-----   Message from Shelby Mattocks, [place] sent at 09/03/2021  4:27 PM EDT ----- ? ? ?

## 2021-09-03 NOTE — Telephone Encounter (Signed)
Patient is going to wait till tomorrow to still see if she still have the abdominal tenderness in LUQ and see if she still have the Blood in stool. Patient will do clear liquids the rest of the day  ?

## 2021-09-03 NOTE — Telephone Encounter (Signed)
PT had colonoscpy on 03/27 and has had severe bleeding for the last couple of days would like a call back ?

## 2021-09-03 NOTE — Telephone Encounter (Signed)
Called and left a message for call back  

## 2021-09-03 NOTE — Telephone Encounter (Signed)
Patient states she had a colonoscopy on Monday and Dr. Marius Ditch removed two large polyps and 2 small polyps. She states yesterday she had a tiny bit of blood on the toilet paper. Today when she went to the bathroom for a bowel movement. She had bright red and block clots in the toilet. She states she has some abdominal tenderness.  ?

## 2021-09-05 ENCOUNTER — Emergency Department
Admission: EM | Admit: 2021-09-05 | Discharge: 2021-09-05 | Disposition: A | Discharge status: Home / Self Care | Payer: 59 | Attending: Emergency Medicine | Admitting: Emergency Medicine

## 2021-09-05 ENCOUNTER — Telehealth: Payer: Self-pay

## 2021-09-05 ENCOUNTER — Emergency Department: Payer: 59

## 2021-09-05 ENCOUNTER — Other Ambulatory Visit: Payer: Self-pay

## 2021-09-05 DIAGNOSIS — I714 Abdominal aortic aneurysm, without rupture, unspecified: Secondary | ICD-10-CM | POA: Diagnosis not present

## 2021-09-05 DIAGNOSIS — Z85828 Personal history of other malignant neoplasm of skin: Secondary | ICD-10-CM | POA: Insufficient documentation

## 2021-09-05 DIAGNOSIS — I7143 Infrarenal abdominal aortic aneurysm, without rupture: Secondary | ICD-10-CM | POA: Diagnosis not present

## 2021-09-05 DIAGNOSIS — I10 Essential (primary) hypertension: Secondary | ICD-10-CM | POA: Diagnosis not present

## 2021-09-05 DIAGNOSIS — R109 Unspecified abdominal pain: Secondary | ICD-10-CM | POA: Diagnosis not present

## 2021-09-05 DIAGNOSIS — N281 Cyst of kidney, acquired: Secondary | ICD-10-CM | POA: Diagnosis not present

## 2021-09-05 DIAGNOSIS — I7133 Infrarenal abdominal aortic aneurysm, ruptured: Secondary | ICD-10-CM | POA: Diagnosis not present

## 2021-09-05 DIAGNOSIS — R1031 Right lower quadrant pain: Secondary | ICD-10-CM | POA: Diagnosis not present

## 2021-09-05 DIAGNOSIS — Z20822 Contact with and (suspected) exposure to covid-19: Secondary | ICD-10-CM | POA: Insufficient documentation

## 2021-09-05 DIAGNOSIS — K5730 Diverticulosis of large intestine without perforation or abscess without bleeding: Secondary | ICD-10-CM | POA: Diagnosis not present

## 2021-09-05 LAB — COMPREHENSIVE METABOLIC PANEL
ALT: 16 U/L (ref 0–44)
AST: 21 U/L (ref 15–41)
Albumin: 4.5 g/dL (ref 3.5–5.0)
Alkaline Phosphatase: 65 U/L (ref 38–126)
Anion gap: 8 (ref 5–15)
BUN: 14 mg/dL (ref 8–23)
CO2: 29 mmol/L (ref 22–32)
Calcium: 9.4 mg/dL (ref 8.9–10.3)
Chloride: 103 mmol/L (ref 98–111)
Creatinine, Ser: 0.61 mg/dL (ref 0.44–1.00)
GFR, Estimated: >60 mL/min (ref >60)
Glucose, Bld: 103 mg/dL — ABNORMAL HIGH (ref 70–99)
Potassium: 3.3 mmol/L — ABNORMAL LOW (ref 3.5–5.1)
Sodium: 140 mmol/L (ref 135–145)
Total Bilirubin: 0.7 mg/dL (ref 0.3–1.2)
Total Protein: 7.9 g/dL (ref 6.5–8.1)

## 2021-09-05 LAB — CBC
HCT: 42.2 % (ref 36.0–46.0)
Hemoglobin: 13.8 g/dL (ref 12.0–15.0)
MCH: 30.1 pg (ref 26.0–34.0)
MCHC: 32.7 g/dL (ref 30.0–36.0)
MCV: 92.1 fL (ref 80.0–100.0)
Platelets: 246 10*3/uL (ref 150–400)
RBC: 4.58 MIL/uL (ref 3.87–5.11)
RDW: 12.1 % (ref 11.5–15.5)
WBC: 7.6 10*3/uL (ref 4.0–10.5)
nRBC: 0 % (ref 0.0–0.2)

## 2021-09-05 LAB — LIPASE, BLOOD: Lipase: 36 U/L (ref 11–51)

## 2021-09-05 LAB — RESP PANEL BY RT-PCR (FLU A&B, COVID) ARPGX2
Influenza A by PCR: NEGATIVE
Influenza B by PCR: NEGATIVE
SARS Coronavirus 2 by RT PCR: NEGATIVE

## 2021-09-05 LAB — PROTIME-INR
INR: 1 (ref 0.8–1.2)
Prothrombin Time: 12.9 seconds (ref 11.4–15.2)

## 2021-09-05 LAB — TYPE AND SCREEN
ABO/RH(D): O POS
Antibody Screen: NEGATIVE

## 2021-09-05 MED ORDER — METRONIDAZOLE 500 MG PO TABS: 500.0000 mg | ORAL_TABLET | Freq: Two times a day (BID) | ORAL | 0 refills | Status: DC

## 2021-09-05 MED ORDER — IOHEXOL 300 MG/ML  SOLN: 100.0000 mL | Freq: Once | INTRAMUSCULAR | Status: DC | PRN

## 2021-09-05 MED ORDER — AMLODIPINE BESYLATE 5 MG PO TABS: 5.0000 mg | ORAL_TABLET | Freq: Every day | ORAL | 0 refills | Status: DC

## 2021-09-05 MED ORDER — CIPROFLOXACIN HCL 500 MG PO TABS: 500.0000 mg | ORAL_TABLET | Freq: Two times a day (BID) | ORAL | 0 refills | Status: DC

## 2021-09-05 NOTE — ED Provider Notes (Signed)
? ?[facility] ?Provider Note ? ? ? Event Date/Time  ? First MD Initiated Contact with Patient 09/05/21 1000   ?  (approximate) ? ? ?History  ? ?GI Problem ? ? ?HPI ? ?Laura Mcdowell is a 61 y.o. female who upon review of recent admission note from 09/01/21 by Dr. Marius Ditch has no noted medical history except skin cancer, but does appear to take several antihypertensive medications. ? ?Discussed with the patient, she advises she has not been on any blood pressure medicine now for several years.  She reports she only takes medication for high cholesterol ?  ?Review of note from March 27 did notes that patient had polyps resected.  There was also diverticulosis noted and nonbleeding hemorrhoid ? ?Since having her colonoscopy she has noticed blood in her stool dark stool, intermittent crampy abdominal pain in multiple areas including right lower right mid and left upper abdomen.  Reports she has a crampy feeling across the abdomen, and continues to have stools that are dark and sometimes bloody. ? ?No lightheadedness no fatigue no chest pain no fevers ? ?Does not wish for any pain medication.  No vomiting.  Drink a soda this morning ? ?Physical Exam  ? ?Triage Vital Signs: ?ED Triage Vitals  ?Enc Vitals Group  ?   BP   ?   Pulse   ?   Resp   ?   Temp   ?   Temp src   ?   SpO2   ?   Weight   ?   Height   ?   Head Circumference   ?   Peak Flow   ?   Pain Score   ?   Pain Loc   ?   Pain Edu?   ?   Excl. in [place]?   ? ? ?Most recent vital signs: ?Vitals:  ? 09/05/21 1221 09/05/21 1309  ?BP: (!) 186/108 (!) 179/105  ?Pulse: 60 72  ?Resp:  17  ?Temp:  98.7 ?F (37.1 ?C)  ?SpO2: 98% 96%  ? ? ? ?General: Awake, no distress.  ?CV:  Good peripheral perfusion.  Normal heart tones ?Resp:  Normal effort.  Clear ?Abd:  No distention.  Reports mild tenderness throughout, no focal tenderness, reports a crampy abdominal discomfort throughout all quadrants on exam.  No rebound or guarding.  No acute  peritonitis ?Other:  Skin warm well perfused ? ? ?ED Results / Procedures / Treatments  ? ?Labs ?(all labs ordered are listed, but only abnormal results are displayed) ?Labs Reviewed  ?COMPREHENSIVE METABOLIC PANEL - Abnormal; Notable for the following components:  ?    Result Value  ? Potassium 3.3 (*)   ? Glucose, Bld 103 (*)   ? All other components within normal limits  ?RESP PANEL BY RT-PCR (FLU A&B, COVID) ARPGX2  ?CBC  ?PROTIME-INR  ?LIPASE, BLOOD  ?POC OCCULT BLOOD, ED  ?TYPE AND SCREEN  ? ? ? ?EKG ? ?Reviewed inter by me at 1015 ?Heart rate 80 ?QRS 80 ?QTc 440 ?Normal sinus rhythm no evidence of acute ischemia.  Compared with previous EKG from November 2007, no significant changes in morphology are noted ? ? ?RADIOLOGY ? 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  Radiologist read is below ? ? ?IMPRESSION: ?1. No CT evidence of acute gastrointestinal hemorrhage on this ?noncontrast enhanced examination. ?  ?  2.  Colonic diverticulosis without evidence of acute diverticulitis. ?  ?3. Aneurysmal dilatation of the infrarenal abdominal aorta measuring ?up to 3.8 cm. Follow-up examination in 2 years is recommended. ?  ?4.  Stable left lower lobe pulmonary nodule measuring up to 6 mm. ?  ?5.  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 ?  ?6. Stable simple renal cysts in the upper pole of the left kidney ?measuring up to 5.0 x 5.3 cm. No further follow-up is recommended. ?PROCEDURES: ? ?Critical Care performed: No ? ?Procedures ? ? ?MEDICATIONS ORDERED IN ED: ?Medications - No data to display ? ? ?IMPRESSION / MDM / ASSESSMENT AND PLAN / ED COURSE  ?I reviewed the triage vital signs and the nursing notes. ?             ?               ? ?Differential diagnosis includes, but is not limited to, GI bleeding, possibly rectal or colon in nature especially given her  recent procedure polypectomies.  Patient is a patient of Dr. Marius Ditch, I have paged Dr. Marius Ditch (1015am) to discuss, and Dr. Marius Ditch advises that she will see the patient in consult and likely within about 2 hours.  In the interim recommends obtaining a normal CT abdomen pelvis with IV contrast only, obtaining labs. ? ?Differential diagnosis includes multiple etiologies for possible bleeding, but given her recent colonoscopy I suspect possibly secondary to her polyps being removed, hemorrhoid, etc. ? ?Patient appears hemodynamically stable, notably severe hypertension on first check, but patient does report that her blood pressure is probably high when she is nervous, she reports that she feels very nervous about the bleeding now.  We will continue to monitor, she does not have any signs or symptoms such as headache chest pain or symptoms that would be associated with hypertensive emergency at this time, rather will monitor and see if this might downtrend as we further work-up ? ? ? ?The patient is on the cardiac monitor to evaluate for evidence of arrhythmia and/or significant heart rate changes. ? ?Clinical Course as of 09/05/21 1330  ?Fri Sep 05, 2021  ?1141 CT staff informed the patient updated them and told them she does in fact have a contrast allergy as well.  She reports she had facial swelling with CT scans secondary to IV dye.  Updated her chart and we have changed her CT to a noncontrasted study [MQ]  ?  ?Clinical Course User Index ?[MQ] Delman Kitten, MD  ? ?Upon consultation, Dr. Marius Ditch advises patient is safe for discharge and would recommend starting Cipro and Flagyl for 10 days and follow-up in their clinic ? ? ?Vitals:  ? 09/05/21 1221 09/05/21 1309  ?BP: (!) 186/108 (!) 179/105  ?Pulse: 60 72  ?Resp:  17  ?Temp:  98.7 ?F (37.1 ?C)  ?SpO2: 98% 96%  ? ? ? 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  She has no clinical signs  or symptoms of suggest acute aortic rupture or dissection.  She understands plan to follow-up with vascular surgery and the importance of this as well as further follow-up with primary care regarding blood pressure management.  Discussed with her multiple medications that she has listed in the past, we will restart her on Norvasc 5 mg daily ? 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  She really does not wish to stay any longer, understands risk of the aorta risk of rupture, morbidity that could be associated with.  She is comfortable resuming Norvasc which I have sent to the pharmacy she requested.  We discussed careful return precautions, she and her husband both in agreement.  She will also complete antibiotic course as recommended by GI after they have seen her ? ?Return precautions and treatment recommendations and follow-up discussed with the patient who is agreeable with the plan.  She does appear appropriate for discharge.  Currently asymptomatic without pain or discomfort.  Remains notably hypertensive but will be restarting Norvasc and she advises she can follow-up closely with her primary doctor vascular surgery and also GI. ? ? ?FINAL CLINICAL IMPRESSION(S) / ED DIAGNOSES  ? ?Final diagnoses:  ?Hypertension, unspecified type  ?Abdominal cramping  ?Infrarenal abdominal aortic aneurysm (AAA) without rupture  ? ? ? ?Rx / DC Orders  ? ?ED Discharge Orders   ? ?      Ordered  ?  amLODipine (NORVASC) 5 MG tablet  Daily       ? 09/05/21 1327  ?  ciprofloxacin (CIPRO) 500 MG tablet  2 times daily       ?  09/05/21 1327  ?  metroNIDAZOLE (FLAGYL) 500 MG tablet  2 times daily       ? 09/05/21 1327  ? ?  ?  ? ?  ? ? ? ?Note:  This document was prepared using Systems analyst and may include unintentional dictation erro

## 2021-09-05 NOTE — Telephone Encounter (Signed)
Patient is having LUQ pain  and RUQ pain that radiates to her lower abdominal pain. Patient states that she is having a low grade fever not higher then 101. She states she is still having black tarry stool with bright red blood with every bowel movement. She states she is just laying In the bed with a heating pad on her stomach. She has been taking extra strength tylenol. She states this is not her and she knows something is going on   ?

## 2021-09-05 NOTE — Discharge Instructions (Addendum)
You were seen in the emergency room for abdominal pain. It is important that you follow up closely with gastroenterologist and also set up close follow-up with vascular surgery. ? ?We will be starting you on 2 antibiotics today, and also restarting you on one of your prior blood pressure medicines. ? ?Please return to the emergency room right away if you are to develop a fever, severe nausea, your pain becomes severe or worsens, you are unable to keep food down, begin vomiting any dark or bloody fluid, you develop any dark or bloody stools, feel dehydrated, or other new concerns or symptoms arise. ? ? ?

## 2021-09-05 NOTE — Telephone Encounter (Signed)
Called patient to assess her condition.  She continues to have abdominal pain, black tarry stools and low-grade fever with chills and night sweats.  Advised her to go to the emergency room right away.  Patient is in the ER.  Discussed with Dr. Jacqualine Code, will get CT abdomen pelvis with contrast, CBC, CMP.  Further recommendations based on the above work-up whether or not admit the patient and to proceed with colonoscopy ? ?Patient expressed understanding of the plan ? ?Cephas Darby, MD ?[facility], [place] ?[address]  ?[address]  ?[place], [place] [address]  ?Main: [phone]  ?Fax: [phone] ?Pager: [phone] ? ?

## 2021-09-05 NOTE — ED Triage Notes (Signed)
Patient to ER via [facility] with complaints of postop problem. Reports having a colonoscopy on Monday and having multiple polyps removed. States since, she has been having bright red blood in her stools and dark tarry stools, fevers, chills, and weakness.  ? ?States she contacted her GI doctor today who recommended she be seen in the ER for further evaluation. ?

## 2021-09-16 ENCOUNTER — Ambulatory Visit
Admission: RE | Admit: 2021-09-16 | Discharge: 2021-09-16 | Disposition: A | Discharge status: Home / Self Care | Payer: 59 | Source: Ambulatory Visit | Attending: Family | Admitting: Family

## 2021-09-16 DIAGNOSIS — Z1231 Encounter for screening mammogram for malignant neoplasm of breast: Secondary | ICD-10-CM | POA: Diagnosis not present

## 2021-09-24 ENCOUNTER — Ambulatory Visit: Payer: 59 | Admitting: Gastroenterology

## 2021-10-10 DIAGNOSIS — M5440 Lumbago with sciatica, unspecified side: Secondary | ICD-10-CM | POA: Diagnosis not present

## 2021-10-10 DIAGNOSIS — I10 Essential (primary) hypertension: Secondary | ICD-10-CM | POA: Diagnosis not present

## 2021-10-10 DIAGNOSIS — E782 Mixed hyperlipidemia: Secondary | ICD-10-CM | POA: Diagnosis not present

## 2021-10-10 DIAGNOSIS — N816 Rectocele: Secondary | ICD-10-CM | POA: Diagnosis not present

## 2021-10-14 ENCOUNTER — Encounter (INDEPENDENT_AMBULATORY_CARE_PROVIDER_SITE_OTHER): Payer: Self-pay | Admitting: Vascular Surgery

## 2021-10-14 ENCOUNTER — Ambulatory Visit (INDEPENDENT_AMBULATORY_CARE_PROVIDER_SITE_OTHER): Payer: 59 | Admitting: Vascular Surgery

## 2021-10-14 VITALS — BP 197/111 | HR 81 | Resp 16 | Wt 180.0 lb

## 2021-10-14 DIAGNOSIS — I10 Essential (primary) hypertension: Secondary | ICD-10-CM | POA: Diagnosis not present

## 2021-10-14 DIAGNOSIS — E785 Hyperlipidemia, unspecified: Secondary | ICD-10-CM | POA: Diagnosis not present

## 2021-10-14 DIAGNOSIS — I714 Abdominal aortic aneurysm, without rupture, unspecified: Secondary | ICD-10-CM

## 2021-10-14 NOTE — Patient Instructions (Signed)
Abdominal Aortic Aneurysm  An abdominal aortic aneurysm (AAA) is an aneurysm that occurs in the lower part of the aorta. The aorta is the main artery of the body, and it supplies blood from the heart to the rest of the body. An aneurysm is a bulge in an artery. An aneurysm happens when blood pushes against a weakened or damaged artery wall. Most aneurysms do not cause symptoms, but some do cause problems. An AAA can cause two serious problems: It can enlarge and burst. It can cause blood to flow between the layers of the wall of the aorta through a tear (aortic dissection). These problems are medical emergencies. They can cause bleeding inside the body. If they are not diagnosed and treated right away, they can be life-threatening. What are the causes? The exact cause of this condition is not known. What increases the risk? The following factors may make you more likely to develop this condition: Being female and 60 years of age or older. Being of [place] descent. Using or having used nicotine or tobacco products. Having a family history of aneurysms. Having any of these conditions: Hardening of your arteries (arteriosclerosis). Inflammation of the walls of an artery (arteritis). Certain genetic conditions. Obesity. An infection in the wall of your aorta (infectious aortitis) caused by bacteria. High cholesterol. High blood pressure (hypertension). What are the signs or symptoms? Symptoms of this condition vary depending on the size of your aneurysm and how fast it is growing. Most aneurysms grow slowly and do not cause symptoms. When symptoms do occur, they may include: Severe pain in your abdomen, side, or lower back. Feeling full after eating only small amounts of food. Feeling a throbbing lump in your abdomen. Painful feet or toes, or discolored skin or sores on feet or toes. Constipation or trouble urinating. Symptoms of an AAA that has burst include: Severe pain in your  abdomen, side, or back that comes on suddenly. Nausea or vomiting. Feeling light-headed or fainting. How is this diagnosed? This condition may be diagnosed with: A physical exam to check for throbbing and to listen to blood flow in your abdomen. Tests, such as: Ultrasound. X-rays. CT scan. MRI. Angiograms. These tests check your arteries for damage or blockage. Because most AAAs that have not burst do not cause symptoms, they are often found during exams for other conditions. How is this treated? Treatment for this condition depends on: The size of your aneurysm. How fast your aneurysm is growing. Your age. Risk factors for a burst AAA. If your aneurysm is smaller than 2 inches (5 cm), your health care provider may: Monitor it regularly to see if it is getting bigger. Depending on the size of the aneurysm, how fast it is growing, and your other risk factors, you may have an ultrasound to monitor it every 3-6 months, every year, or every few years. Give you medicines to control blood pressure, treat pain, or fight infection. If your aneurysm is larger than 2 inches (5 cm), your health care provider may repair it with surgery. Follow these instructions at home: Eating and drinking  Eat a heart-healthy diet. This includes plenty of fresh fruits and vegetables, whole grains, low-fat (lean) protein, and low-fat dairy products. Avoid foods that are high in saturated fat and cholesterol, such as red meat and some dairy products. Lifestyle     Do not use any products that contain nicotine or tobacco, such as cigarettes, e-cigarettes, and chewing tobacco. If you need help quitting, ask your   health care provider. Stay physically active and exercise regularly. Talk with your health care provider about how often to exercise and which types of exercise are safe for you. Maintain a healthy weight. Alcohol use Do not drink alcohol if: Your health care provider tells you not to drink. You are  pregnant, may be pregnant, or are planning to become pregnant. If you drink alcohol: Limit how much you use to: 0-1 drink a day for women. 0-2 drinks a day for men. Be aware of how much alcohol is in your drink. In the [place] one drink equals one 12 oz bottle of beer (355 mL), one 5 oz glass of wine (148 mL), or one 1 oz glass of hard liquor (44 mL). General instructions Take over-the-counter and prescription medicines only as told by your health care provider. Keep your blood pressure within a normal range. Check it regularly, and ask your health care provider what your target blood pressure should be. Have your blood sugar (glucose) level and cholesterol levels checked regularly. Follow instructions on how to keep levels within normal limits. Avoid heavy lifting and activities that take a lot of effort. Ask what activities are safe for you. If you can, learn your family's health history. Keep all follow-up visits as told by your health care provider. This is important. Contact a health care provider if you have: Pain in your abdomen, side, or back. Throbbing in your abdomen. A fever. Get help right away if: You have sudden, severe pain in your abdomen, side, or back. You experience nausea or vomiting. You feel light-headed or you faint. Your heart beats fast when you stand. You have sweaty, clammy skin. You have shortness of breath. You have constipation or trouble urinating. These symptoms may represent a serious problem that is an emergency. Do not wait to see if the symptoms will go away. Get medical help right away. Call your local emergency services (911 in the [place] Do not drive yourself to the hospital. Summary An aneurysm is a bulge in an artery. An abdominal aortic aneurysm (AAA) is an aneurysm in the lower part of the aorta. An AAA can cause bleeding inside the body, and it can be life-threatening. Risk can increase if you are female, age 60 or older, and of [place]  descent, or if you have used nicotine or tobacco products and have a family history of aneurysms. Get help right away if you have symptoms of a burst AAA. This information is not intended to replace advice given to you by your health care provider. Make sure you discuss any questions you have with your health care provider. Document Revised: 03/10/2019 Document Reviewed: 03/10/2019 Elsevier Patient Education  2023 [employer] Inc[employer]  

## 2021-10-14 NOTE — Assessment & Plan Note (Signed)
This CT scan demonstrates a 3.8 cm infrarenal abdominal aortic aneurysm with significant calcification of the aorta and iliac arteries.  This was not a CT angiogram and I really cannot quantitate any degree of stenosis associated with this calcification either.  There is no evidence of bleeding or complicating features on this limited CT scan.   ? ?Recommend: ?No surgery or intervention is indicated at this time. ? ?The patient has an asymptomatic abdominal aortic aneurysm that is nearing 4 cm but less than 5 cm in maximal diameter.  The first time this was diagnosed was approximately 2 months ago with no previous knowledge of the aneurysm ? ?I have reviewed the natural history of abdominal aortic aneurysm and the small risk of rupture for aneurysm less than 5 cm in size.  However, as these small aneurysms tend to enlarge over time, continued surveillance with ultrasound or CT scan is mandatory.  ? ?I have also discussed optimizing medical management with hypertension and lipid control and the importance of abstinence from tobacco.  The patient is also encouraged to exercise a minimum of 30 minutes 4 times a week.  ? ?Should the patient develop new onset abdominal or back pain or signs of peripheral embolization they are instructed to seek medical attention immediately and to alert the physician providing care that they have an aneurysm.  ? ?The patient voices their understanding. ? ?I have scheduled the patient to return in 4 months with an aortic duplex. ?

## 2021-10-14 NOTE — Progress Notes (Signed)
? ? ?Patient ID: Laura Mcdowell, female   DOB: 07/21/1960, 61 y.o.   MRN: [id number] ? ?Chief Complaint  ?Patient presents with  ? New Patient (Initial Visit)  ?  Consult [facility] ED ct inepic  ? ? ?HPI ?Laura Mcdowell is a 61 y.o. female.  I am asked to see the patient by Quale in the [facility] ER for evaluation of an incidental finding of a 3.8 cm abdominal aortic aneurysm seen on a CT scan from March of this year.  She was in the emergency room for other issues and had a CT scan of the abdomen pelvis which I have independently reviewed.  She was having difficulty after colonoscopy with bleeding and irregular bowel issues.  This CT scan demonstrates a 3.8 cm infrarenal abdominal aortic aneurysm with significant calcification of the aorta and iliac arteries.  This was not a CT angiogram and I really cannot quantitate any degree of stenosis associated with this calcification either.  There is no evidence of bleeding or complicating features on this limited CT scan.  She does report that she had a grandmother who had an aneurysm repair over 20 years ago.  She is a retired Therapist, sports.  Her blood pressure has been quite high lately.  She previously smoked but does not smoke at this time.  She reports her father died at a young age but she does not know of other family members who had aneurysms ? ? ?Past Medical History:  ?Diagnosis Date  ? Arthritis   ? Cancer [facility])   ? skin  ? Hyperlipidemia   ? Hypertension   ? ? ?Past Surgical History:  ?Procedure Laterality Date  ? APPENDECTOMY    ? COLONOSCOPY WITH PROPOFOL N/A 09/01/2021  ? Procedure: COLONOSCOPY WITH PROPOFOL;  Surgeon: Lin Landsman, MD;  Location: [facility] ENDOSCOPY;  Service: Gastroenterology;  Laterality: N/A;  Wants 1st per office  ? MOHS SURGERY    ? NECK SURGERY    ? TONSILLECTOMY    ? ? ? ?Family History  ?Problem Relation Age of Onset  ? Osteoporosis Mother   ? Intracerebral hemorrhage Father   ? Melanoma Brother   ? AAA (abdominal aortic aneurysm) Maternal Grandmother    ?Abdominal aortic aneurysm in a grandmother ? ? ?Social History  ? ?Tobacco Use  ? Smoking status: Former  ?  Types: Cigarettes  ? Smokeless tobacco: Never  ?Substance Use Topics  ? Alcohol use: Never  ? Drug use: Never  ? ? ? ?Allergies  ?Allergen Reactions  ? Ivp Dye [Iodinated Contrast Media] Swelling  ?  Facial swelling  ? Penicillins   ? Prochlorperazine   ? ? ?Current Outpatient Medications  ?Medication Sig Dispense Refill  ? rosuvastatin (CRESTOR) 5 MG tablet Take 5 mg by mouth daily.    ? spironolactone (ALDACTONE) 50 MG tablet Take 50 mg by mouth daily.    ? amLODipine (NORVASC) 5 MG tablet Take 1 tablet (5 mg total) by mouth daily. 30 tablet 0  ? ciprofloxacin (CIPRO) 500 MG tablet Take 1 tablet (500 mg total) by mouth 2 (two) times daily. 20 tablet 0  ? metroNIDAZOLE (FLAGYL) 500 MG tablet Take 1 tablet (500 mg total) by mouth 2 (two) times daily. 20 tablet 0  ? ?No current facility-administered medications for this visit.  ? ? ? ? ?REVIEW OF SYSTEMS (Negative unless checked) ? ?Constitutional: '[]'$ Weight loss  '[]'$ Fever  '[]'$ Chills ?Cardiac: '[]'$ Chest pain   '[]'$ Chest pressure   '[]'$ Palpitations   '[]'$ Shortness of breath  when laying flat   '[]'$ Shortness of breath at rest   '[]'$ Shortness of breath with exertion. ?Vascular:  '[]'$ Pain in legs with walking   '[]'$ Pain in legs at rest   '[]'$ Pain in legs when laying flat   '[]'$ Claudication   '[]'$ Pain in feet when walking  '[]'$ Pain in feet at rest  '[]'$ Pain in feet when laying flat   '[]'$ History of DVT   '[]'$ Phlebitis   '[]'$ Swelling in legs   '[]'$ Varicose veins   '[]'$ Non-healing ulcers ?Pulmonary:   '[]'$ Uses home oxygen   '[]'$ Productive cough   '[]'$ Hemoptysis   '[]'$ Wheeze  '[]'$ COPD   '[]'$ Asthma ?Neurologic:  '[]'$ Dizziness  '[]'$ Blackouts   '[]'$ Seizures   '[]'$ History of stroke   '[]'$ History of TIA  '[]'$ Aphasia   '[]'$ Temporary blindness   '[]'$ Dysphagia   '[]'$ Weakness or numbness in arms   '[]'$ Weakness or numbness in legs ?Musculoskeletal:  '[x]'$ Arthritis   '[]'$ Joint swelling   '[]'$ Joint pain   '[x]'$ Low back pain ?Hematologic:  '[]'$ Easy bruising   '[]'$ Easy bleeding   '[]'$ Hypercoagulable state   '[]'$ Anemic  '[]'$ Hepatitis ?Gastrointestinal:  '[]'$ Blood in stool   '[]'$ Vomiting blood  '[]'$ Gastroesophageal reflux/heartburn   '[x]'$ Abdominal pain ?Genitourinary:  '[]'$ Chronic kidney disease   '[]'$ Difficult urination  '[]'$ Frequent urination  '[]'$ Burning with urination   '[]'$ Hematuria ?Skin:  '[]'$ Rashes   '[]'$ Ulcers   '[]'$ Wounds ?Psychological:  '[]'$ History of anxiety   '[]'$  History of major depression. ? ? ? ?Physical Exam ?BP (!) 197/111 (BP Location: Right Arm)   Pulse 81   Resp 16   Wt 180 lb (81.6 kg)   BMI 28.19 kg/m?  ?Gen:  WD/WN, NAD.  Appears younger than stated age ?Head: [place]/AT, No temporalis wasting.  ?Ear/Nose/Throat: Hearing grossly intact, nares w/o erythema or drainage, oropharynx w/o Erythema/Exudate ?Eyes: Conjunctiva clear, sclera non-icteric  ?Neck: trachea midline.  No JVD.  No bruit ?Pulmonary:  Good air movement, respirations not labored, no use of accessory muscles  ?Cardiac: RRR, no JVD ?Vascular:  ?Vessel Right Left  ?Radial Palpable Palpable  ?    ?    ?    ?    ?    ?    ?    ?    ? ?Gastrointestinal:. No masses, surgical incisions, or scars.  Mildly increased aortic impulse ?Musculoskeletal: M/S 5/5 throughout.  Extremities without ischemic changes.  No deformity or atrophy.  No significant lower extremity edema. ?Neurologic: Sensation grossly intact in extremities.  Symmetrical.  Speech is fluent. Motor exam as listed above. ?Psychiatric: Judgment intact, Mood & affect appropriate for pt's clinical situation. ?Dermatologic: No rashes or ulcers noted.  No cellulitis or open wounds. ? ? ? ?Radiology ?MM 3D SCREEN BREAST BILATERAL ? ?Result Date: 09/17/2021 ?CLINICAL DATA:  Screening. EXAM: DIGITAL SCREENING BILATERAL MAMMOGRAM WITH TOMOSYNTHESIS AND CAD TECHNIQUE: Bilateral screening digital craniocaudal and mediolateral oblique mammograms were obtained. Bilateral screening digital breast tomosynthesis was performed. The images were evaluated with computer-aided detection.  COMPARISON:  Previous exam(s). ACR Breast Density Category b: There are scattered areas of fibroglandular density. FINDINGS: There are no findings suspicious for malignancy. IMPRESSION: No mammographic evidence of malignancy. A result letter of this screening mammogram will be mailed directly to the patient. RECOMMENDATION: Screening mammogram in one year. (Code:SM-B-01Y) BI-RADS CATEGORY  1: Negative. Electronically Signed   By: Marin Olp M.D.   On: 09/17/2021 11:44   ? ?Labs ?Recent Results (from the past 2160 hour(s))  ?Surgical pathology     Status: None  ? Collection Time: 09/01/21  8:05 AM  ?Result Value Ref Range  ?  SURGICAL PATHOLOGY    ?  SURGICAL PATHOLOGY ?CASE: [id number] ?PATIENT: Marline Lynk ?Surgical Pathology Report ? ? ? ? ?Specimen Submitted: ?A. Colon polyp, cecum; hot snare ?B. Colon polyp, ileocecal valve; cbx ?C. Colon polyp, transverse; hot snare ?D. Rectum polyp; cbx ? ?Clinical History: Colon cancer screening Z12.11.  Diverticulosis; ?polyps; hemorrhoids ? ? ? ? ? ?DIAGNOSIS: ?A. COLON POLYP, CECUM; HOT SNARE: ?- SESSILE SERRATED POLYP. ?- NEGATIVE FOR DYSPLASIA AND MALIGNANCY. ? ?B.  COLON POLYP, ILEOCECAL VALVE; COLD BIOPSY: ?- HYPERPLASTIC POLYP. ?- NEGATIVE FOR DYSPLASIA AND MALIGNANCY. ? ?C.  COLON POLYP, TRANSVERSE; HOT SNARE: ?- SESSILE SERRATED POLYP. ?- NEGATIVE FOR DYSPLASIA AND MALIGNANCY. ? ?D.  RECTUM POLYP; COLD BIOPSY: ?- TUBULAR ADENOMA. ?- NEGATIVE FOR HIGH-GRADE DYSPLASIA AND MALIGNANCY. ? ?GROSS DESCRIPTION: ?A. Labeled: Hot snare cecal polyp ?Received: Formalin ?Collection time: 8:05 AM on 09/01/2021 ?Placed into formalin time: 8:05 AM on 09/01/2021 ?Tissue fragment(s): Multiple ?Si ze: Aggregate, 2 x 0.6 x 0.3 cm ?Description: Received are fragments of tan-pink soft tissue admixed with ?a scant amount of intestinal debris.  The ratio of soft tissue to ?intestinal debris is 95: 5. ?Entirely submitted in 1 cassette. ? ?B. Labeled: cbx ileocecal valve polyp ?Received:  Formalin ?Collection time: 8:21 AM on 09/01/2021 ?Placed into formalin time: 8:21 AM on 09/01/2021 ?Tissue fragment(s): 1 ?Size: 0.4 x 0.3 x 0.2 cm ?Description: Pink soft tissue fragment ?Entirely submit

## 2021-10-14 NOTE — Assessment & Plan Note (Signed)
blood pressure control important in reducing the progression of atherosclerotic disease and aneurysmal growth. On appropriate oral medications.  

## 2021-10-14 NOTE — Assessment & Plan Note (Signed)
lipid control important in reducing the progression of atherosclerotic disease. Continue statin therapy  

## 2021-10-15 DIAGNOSIS — M47896 Other spondylosis, lumbar region: Secondary | ICD-10-CM | POA: Diagnosis not present

## 2021-10-15 DIAGNOSIS — M5416 Radiculopathy, lumbar region: Secondary | ICD-10-CM | POA: Diagnosis not present

## 2021-10-15 DIAGNOSIS — M1711 Unilateral primary osteoarthritis, right knee: Secondary | ICD-10-CM | POA: Diagnosis not present

## 2021-10-20 DIAGNOSIS — M1711 Unilateral primary osteoarthritis, right knee: Secondary | ICD-10-CM | POA: Diagnosis not present

## 2021-11-10 DIAGNOSIS — K219 Gastro-esophageal reflux disease without esophagitis: Secondary | ICD-10-CM | POA: Diagnosis not present

## 2021-11-10 DIAGNOSIS — E782 Mixed hyperlipidemia: Secondary | ICD-10-CM | POA: Diagnosis not present

## 2021-11-10 DIAGNOSIS — I10 Essential (primary) hypertension: Secondary | ICD-10-CM | POA: Diagnosis not present

## 2021-11-18 DIAGNOSIS — D225 Melanocytic nevi of trunk: Secondary | ICD-10-CM | POA: Diagnosis not present

## 2021-11-18 DIAGNOSIS — D2261 Melanocytic nevi of right upper limb, including shoulder: Secondary | ICD-10-CM | POA: Diagnosis not present

## 2021-11-18 DIAGNOSIS — C44712 Basal cell carcinoma of skin of right lower limb, including hip: Secondary | ICD-10-CM | POA: Diagnosis not present

## 2021-11-18 DIAGNOSIS — X32XXXA Exposure to sunlight, initial encounter: Secondary | ICD-10-CM | POA: Diagnosis not present

## 2021-11-18 DIAGNOSIS — L821 Other seborrheic keratosis: Secondary | ICD-10-CM | POA: Diagnosis not present

## 2021-11-18 DIAGNOSIS — D2262 Melanocytic nevi of left upper limb, including shoulder: Secondary | ICD-10-CM | POA: Diagnosis not present

## 2021-11-18 DIAGNOSIS — Z85828 Personal history of other malignant neoplasm of skin: Secondary | ICD-10-CM | POA: Diagnosis not present

## 2021-11-18 DIAGNOSIS — D485 Neoplasm of uncertain behavior of skin: Secondary | ICD-10-CM | POA: Diagnosis not present

## 2021-11-18 DIAGNOSIS — L570 Actinic keratosis: Secondary | ICD-10-CM | POA: Diagnosis not present

## 2021-11-26 ENCOUNTER — Other Ambulatory Visit: Payer: Self-pay | Admitting: Orthopedic Surgery

## 2021-11-26 ENCOUNTER — Encounter
Admission: RE | Admit: 2021-11-26 | Discharge: 2021-11-26 | Disposition: A | Discharge status: Home / Self Care | Payer: 59 | Source: Ambulatory Visit | Attending: Orthopedic Surgery | Admitting: Orthopedic Surgery

## 2021-11-26 ENCOUNTER — Other Ambulatory Visit: Payer: Self-pay

## 2021-11-26 ENCOUNTER — Encounter: Payer: Self-pay | Admitting: Orthopedic Surgery

## 2021-11-26 ENCOUNTER — Ambulatory Visit: Payer: Self-pay | Admitting: Orthopedic Surgery

## 2021-11-26 VITALS — BP 187/113 | HR 86 | Temp 97.5°F | Resp 18 | Ht 67.0 in | Wt 179.3 lb

## 2021-11-26 DIAGNOSIS — I10 Essential (primary) hypertension: Secondary | ICD-10-CM

## 2021-11-26 DIAGNOSIS — Z01818 Encounter for other preprocedural examination: Secondary | ICD-10-CM

## 2021-11-26 HISTORY — DX: Other specified postprocedural states: R11.2

## 2021-11-26 NOTE — H&P (Signed)
Laura Mcdowell MRN:  [id number] DOB/SEX:  04-09-1961/female  CHIEF COMPLAINT:  Painful right Knee  HISTORY: Patient is a 61 y.o. female presented with a history of pain in the right knee. Onset of symptoms was gradual starting several years ago with gradually worsening course since that time. Prior procedures on the knee include none. Patient has been treated conservatively with over-the-counter NSAIDs and activity modification. Patient currently rates pain in the knee at 10 out of 10 with activity. There is pain at night.  PAST MEDICAL HISTORY: Patient Active Problem List   Diagnosis Date Noted   Abdominal aortic aneurysm (AAA) 3.0 cm to 5.0 cm in diameter in female [facility]) 09/05/2021   Vitreous detachment 08/09/2021   Other disorder of bone and cartilage 10/16/2020   Hyperlipidemia 10/16/2020   Microscopic hematuria 09/13/2020   Actinic keratosis 09/13/2020   Hemangioma 04/15/2011   Essential hypertension 05/20/2006   Past Medical History:  Diagnosis Date   Arthritis    Cancer ([place])    skin   Hyperlipidemia    Hypertension    Past Surgical History:  Procedure Laterality Date   APPENDECTOMY     COLONOSCOPY WITH PROPOFOL N/A 09/01/2021   Procedure: COLONOSCOPY WITH PROPOFOL;  Surgeon: Lin Landsman, MD;  Location: [facility] ENDOSCOPY;  Service: Gastroenterology;  Laterality: N/A;  Wants 1st per office   MOHS SURGERY     NECK SURGERY     TONSILLECTOMY       MEDICATIONS:  (Not in a hospital admission)   ALLERGIES:   Allergies  Allergen Reactions   Ivp Dye [Iodinated Contrast Media] Swelling    Facial swelling   Penicillins    Prochlorperazine     REVIEW OF SYSTEMS:  Pertinent items are noted in HPI.   FAMILY HISTORY:   Family History  Problem Relation Age of Onset   Osteoporosis Mother    Intracerebral hemorrhage Father    Melanoma Brother    AAA (abdominal aortic aneurysm) Maternal Grandmother     SOCIAL HISTORY:   Social History   Tobacco Use   Smoking  status: Former    Types: Cigarettes   Smokeless tobacco: Never  Substance Use Topics   Alcohol use: Never     EXAMINATION:  Vital signs in last 24 hours: '@VSRANGES'$ @  General appearance: alert, cooperative, and no distress Neck: no JVD and supple, symmetrical, trachea midline Lungs: clear to auscultation bilaterally Heart: regular rate and rhythm, S1, S2 normal, no murmur, click, rub or gallop Abdomen: soft, non-tender; bowel sounds normal; no masses,  no organomegaly Extremities: extremities normal, atraumatic, no cyanosis or edema and Homans sign is negative, no sign of DVT Pulses: 2+ and symmetric Skin: Skin color, texture, turgor normal. No rashes or lesions  Musculoskeletal:  ROM 0-110, Ligaments intact,  Imaging Review Plain radiographs demonstrate severe degenerative joint disease of the right knee. The overall alignment is significant varus. The bone quality appears to be good for age and reported activity level.  Assessment/Plan: Primary osteoarthritis, right knee   The patient history, physical examination and imaging studies are consistent with advanced degenerative joint disease of the right knee. The patient has failed conservative treatment.  The clearance notes were reviewed.  After discussion with the patient it was felt that Total Knee Replacement was indicated. The procedure,  risks, and benefits of total knee arthroplasty were presented and reviewed. The risks including but not limited to aseptic loosening, infection, blood clots, vascular injury, stiffness, patella tracking problems complications among others were discussed. The  patient acknowledged the explanation, agreed to proceed with the plan.  Carlynn Spry 11/26/2021, 7:26 AM

## 2021-11-26 NOTE — Patient Instructions (Addendum)
Your procedure is scheduled on: Monday 12/01/21 Report to the Registration Desk on the 1st floor of the [facility]. To find out your arrival time, please call [phone] between 1PM - 3PM on: Friday 11/28/21 If your arrival time is 6:00 am, do not arrive prior to that time as the [place] entrance doors do not open until 6:00 am.  REMEMBER: Instructions that are not followed completely may result in serious medical risk, up to and including death; or upon the discretion of your surgeon and anesthesiologist your surgery may need to be rescheduled.  Do not eat or drink after midnight the night before surgery.  No gum chewing, lozengers or hard candies.  TAKE THESE MEDICATIONS THE MORNING OF SURGERY WITH A SIP OF WATER: None  One week prior to surgery: Stop Anti-inflammatories (NSAIDS) such as Advil, Aleve, Ibuprofen, Motrin, Naproxen, Naprosyn and Aspirin based products such as Excedrin, Goodys Powder, BC Powder.  Stop taking your Vitamin D 2000 units ANY OVER THE COUNTER supplements until after surgery.  You may however, continue to take Tylenol if needed for pain up until the day of surgery.  No Alcohol for 24 hours before or after surgery.  No Smoking including e-cigarettes for 24 hours prior to surgery.  No chewable tobacco products for at least 6 hours prior to surgery.  No nicotine patches on the day of surgery.  Do not use any "recreational" drugs for at least a week prior to your surgery.  Please be advised that the combination of cocaine and anesthesia may have negative outcomes, up to and including death. If you test positive for cocaine, your surgery will be cancelled.  On the morning of surgery brush your teeth with toothpaste and water, you may rinse your mouth with mouthwash if you wish. Do not swallow any toothpaste or mouthwash.  Use CHG Soap as directed on instruction sheet.  Do not wear jewelry, make-up, hairpins, clips or nail polish.  Do not wear  lotions, powders, or perfumes.   Do not shave body from the neck down 48 hours prior to surgery just in case you cut yourself which could leave a site for infection.  Also, freshly shaved skin may become irritated if using the CHG soap.  Do not bring valuables to the hospital. [facility] is not responsible for any missing/lost belongings or valuables.   Notify your doctor if there is any change in your medical condition (cold, fever, infection).  Wear comfortable clothing (specific to your surgery type) to the hospital.  After surgery, you can help prevent lung complications by doing breathing exercises.  Take deep breaths and cough every 1-2 hours. Your doctor may order a device called an Incentive Spirometer to help you take deep breaths.  If you are being admitted to the hospital overnight, leave your suitcase in the car. After surgery it may be brought to your room.  If you are being discharged the day of surgery, you will not be allowed to drive home. You will need a responsible adult (18 years or older) to drive you home and stay with you that night.   If you are taking public transportation, you will need to have a responsible adult (18 years or older) with you. Please confirm with your physician that it is acceptable to use public transportation.   Please call the [place] Dept. at [phone] if you have any questions about these instructions.  Surgery Visitation Policy:  Patients undergoing a surgery or procedure may have  two family members or support persons with them as long as the person is not COVID-19 positive or experiencing its symptoms.   Inpatient Visitation:    Visiting hours are 7 a.m. to 8 p.m. Up to four visitors are allowed at one time in a patient room, including children. The visitors may rotate out with other people during the day. One designated support person (adult) may remain overnight.

## 2021-12-01 ENCOUNTER — Ambulatory Visit: Admission: RE | Admit: 2021-12-01 | Payer: 59 | Source: Home / Self Care | Admitting: Orthopedic Surgery

## 2021-12-01 ENCOUNTER — Encounter: Admission: RE | Payer: Self-pay | Source: Home / Self Care

## 2021-12-01 DIAGNOSIS — C44712 Basal cell carcinoma of skin of right lower limb, including hip: Secondary | ICD-10-CM | POA: Diagnosis not present

## 2021-12-01 SURGERY — ARTHROPLASTY, KNEE, TOTAL
Anesthesia: Spinal | Site: Knee | Laterality: Right

## 2022-01-16 ENCOUNTER — Ambulatory Visit (INDEPENDENT_AMBULATORY_CARE_PROVIDER_SITE_OTHER): Payer: 59 | Admitting: Vascular Surgery

## 2022-01-16 ENCOUNTER — Encounter (INDEPENDENT_AMBULATORY_CARE_PROVIDER_SITE_OTHER): Payer: Self-pay | Admitting: Vascular Surgery

## 2022-01-16 ENCOUNTER — Ambulatory Visit (INDEPENDENT_AMBULATORY_CARE_PROVIDER_SITE_OTHER): Payer: 59

## 2022-01-16 VITALS — BP 175/113 | HR 81 | Resp 17 | Ht 67.0 in | Wt 182.0 lb

## 2022-01-16 DIAGNOSIS — I10 Essential (primary) hypertension: Secondary | ICD-10-CM

## 2022-01-16 DIAGNOSIS — I714 Abdominal aortic aneurysm, without rupture, unspecified: Secondary | ICD-10-CM

## 2022-01-16 DIAGNOSIS — E785 Hyperlipidemia, unspecified: Secondary | ICD-10-CM

## 2022-01-16 NOTE — Progress Notes (Signed)
MRN : [id number]  Laura Mcdowell is a 61 y.o. (April 18, 1961) female who presents with chief complaint of No chief complaint on file. Marland Kitchen  History of Present Illness: Patient returns today in follow up of her abdominal aortic aneurysm.  She reports occasional abdominal tenderness and pain but nothing severe.  No back pain or signs of peripheral embolization. Duplex today suggest significant growth of the abdominal aortic aneurysm up to 4.8 cm in maximal diameter  Current Outpatient Medications  Medication Sig Dispense Refill   cholecalciferol (VITAMIN D3) 25 MCG (1000 UNIT) tablet Take 2,000 Units by mouth daily.     losartan (COZAAR) 50 MG tablet Take 50 mg by mouth daily.     rosuvastatin (CRESTOR) 5 MG tablet Take 5 mg by mouth daily.     spironolactone (ALDACTONE) 50 MG tablet Take 50 mg by mouth daily.     No current facility-administered medications for this visit.    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   Hyperlipidemia    Hypertension    PONV (postoperative nausea and vomiting)     Past Surgical History:  Procedure Laterality Date   APPENDECTOMY     COLONOSCOPY WITH PROPOFOL N/A 09/01/2021   Procedure: COLONOSCOPY WITH PROPOFOL;  Surgeon: Lin Landsman, MD;  Location: [facility] ENDOSCOPY;  Service: Gastroenterology;  Laterality: N/A;  Wants 1st per office   ECTOPIC PREGNANCY SURGERY  1995   MOHS SURGERY     NECK SURGERY     TONSILLECTOMY       Social History   Tobacco Use   Smoking status: Former    Types: Cigarettes   Smokeless tobacco: Never  Vaping Use   Vaping Use: Never used  Substance Use Topics   Alcohol use: Never   Drug use: Never      Family History  Problem Relation Age of Onset   Osteoporosis Mother    Intracerebral hemorrhage Father    Melanoma Brother    AAA (abdominal aortic aneurysm) Maternal Grandmother      Allergies  Allergen Reactions   Penicillins  Anaphylaxis   Ivp Dye [Iodinated Contrast Media] Swelling    Facial swelling   Phenergan [Promethazine] Other (See Comments)    Ocular motor crisis   Prochlorperazine Other (See Comments)    Ocular motor crisis    REVIEW OF SYSTEMS (Negative unless checked)   Constitutional: '[]'$ Weight loss  '[]'$ Fever  '[]'$ Chills Cardiac: '[]'$ Chest pain   '[]'$ Chest pressure   '[]'$ Palpitations   '[]'$ Shortness of breath when laying flat   '[]'$ Shortness of breath at rest   '[]'$ Shortness of breath with exertion. Vascular:  '[]'$ Pain in legs with walking   '[]'$ Pain in legs at rest   '[]'$ Pain in legs when laying flat   '[]'$ Claudication   '[]'$ Pain in feet when walking  '[]'$ Pain in feet at rest  '[]'$ Pain in feet when laying flat   '[]'$ History of DVT   '[]'$ Phlebitis   '[]'$ Swelling in legs   '[]'$ Varicose veins   '[]'$ Non-healing ulcers Pulmonary:   '[]'$ Uses home oxygen   '[]'$ Productive cough   '[]'$ Hemoptysis   '[]'$ Wheeze  '[]'$ COPD   '[]'$ Asthma Neurologic:  '[]'$ Dizziness  '[]'$ Blackouts   '[]'$ Seizures   '[]'$ History of stroke   '[]'$ History of TIA  '[]'$ Aphasia   '[]'$ Temporary blindness   '[]'$ Dysphagia   '[]'$ Weakness or numbness in arms   '[]'$ Weakness or numbness in legs Musculoskeletal:  '[x]'$ Arthritis   '[]'$ Joint swelling   '[]'$   Joint pain   '[x]'$ Low back pain Hematologic:  '[]'$ Easy bruising  '[]'$ Easy bleeding   '[]'$ Hypercoagulable state   '[]'$ Anemic  '[]'$ Hepatitis Gastrointestinal:  '[]'$ Blood in stool   '[]'$ Vomiting blood  '[]'$ Gastroesophageal reflux/heartburn   '[x]'$ Abdominal pain Genitourinary:  '[]'$ Chronic kidney disease   '[]'$ Difficult urination  '[]'$ Frequent urination  '[]'$ Burning with urination   '[]'$ Hematuria Skin:  '[]'$ Rashes   '[]'$ Ulcers   '[]'$ Wounds Psychological:  '[]'$ History of anxiety   '[]'$  History of major depression.    Physical Examination  BP (!) 175/113 (BP Location: Right Arm)   Pulse 81   Resp 17   Ht '5\' 7"'$  (1.702 m)   Wt 182 lb (82.6 kg)   BMI 28.51 kg/m  Gen:  WD/WN, NAD Head: [place]/AT, No temporalis wasting. Ear/Nose/Throat: Hearing grossly intact, nares w/o erythema or drainage Eyes: Conjunctiva clear.  Sclera non-icteric Neck: Supple.  Trachea midline Pulmonary:  Good air movement, no use of accessory muscles.  Cardiac: RRR, no JVD Vascular:  Vessel Right Left  Radial Palpable Palpable                                   Gastrointestinal: soft, non-tender/non-distended. No guarding/reflex. Increased aortic impulse Musculoskeletal: M/S 5/5 throughout.  No deformity or atrophy. No edema. Neurologic: Sensation grossly intact in extremities.  Symmetrical.  Speech is fluent.  Psychiatric: Judgment intact, Mood & affect appropriate for pt's clinical situation. Dermatologic: No rashes or ulcers noted.  No cellulitis or open wounds.     Labs No results found for this or any previous visit (from the past 2160 hour(s)).  Radiology No results found.  Assessment/Plan Essential hypertension blood pressure control important in reducing the progression of atherosclerotic disease and aneurysmal growth. On appropriate oral medications.     Hyperlipidemia lipid control important in reducing the progression of atherosclerotic disease. Continue statin therapy  Abdominal aortic aneurysm (AAA) 3.0 cm to 5.0 cm in diameter in female [facility]) Duplex today suggest significant growth of the abdominal aortic aneurysm up to 4.8 cm in maximal diameter.  She has an allergy to contrast and so we discussed a CT scan but would likely hold off until we thought she would need a repair.  We discussed getting her blood pressure under control being important to avoid growth.  Were going to do a short interval follow-up of 3 months and if she has any further growth, we will get a CT scan and plan repair.    Leotis Pain, MD  01/16/2022 9:24 AM    This note was created with Dragon medical transcription system.  Any errors from dictation are purely unintentional

## 2022-01-16 NOTE — Assessment & Plan Note (Signed)
Duplex today suggest significant growth of the abdominal aortic aneurysm up to 4.8 cm in maximal diameter.  She has an allergy to contrast and so we discussed a CT scan but would likely hold off until we thought she would need a repair.  We discussed getting her blood pressure under control being important to avoid growth.  Were going to do a short interval follow-up of 3 months and if she has any further growth, we will get a CT scan and plan repair.

## 2022-02-11 DIAGNOSIS — I10 Essential (primary) hypertension: Secondary | ICD-10-CM | POA: Diagnosis not present

## 2022-02-11 DIAGNOSIS — E039 Hypothyroidism, unspecified: Secondary | ICD-10-CM | POA: Diagnosis not present

## 2022-02-11 DIAGNOSIS — I7300 Raynaud's syndrome without gangrene: Secondary | ICD-10-CM | POA: Diagnosis not present

## 2022-02-11 DIAGNOSIS — R7303 Prediabetes: Secondary | ICD-10-CM | POA: Diagnosis not present

## 2022-02-11 DIAGNOSIS — E782 Mixed hyperlipidemia: Secondary | ICD-10-CM | POA: Diagnosis not present

## 2022-02-17 ENCOUNTER — Emergency Department
Admission: EM | Admit: 2022-02-17 | Discharge: 2022-02-18 | Disposition: A | Discharge status: Home / Self Care | Payer: 59 | Attending: Emergency Medicine | Admitting: Emergency Medicine

## 2022-02-17 ENCOUNTER — Emergency Department: Payer: 59

## 2022-02-17 ENCOUNTER — Other Ambulatory Visit: Payer: Self-pay

## 2022-02-17 DIAGNOSIS — Z85828 Personal history of other malignant neoplasm of skin: Secondary | ICD-10-CM | POA: Diagnosis not present

## 2022-02-17 DIAGNOSIS — R079 Chest pain, unspecified: Secondary | ICD-10-CM

## 2022-02-17 DIAGNOSIS — R0789 Other chest pain: Secondary | ICD-10-CM | POA: Diagnosis not present

## 2022-02-17 DIAGNOSIS — R0602 Shortness of breath: Secondary | ICD-10-CM | POA: Diagnosis not present

## 2022-02-17 DIAGNOSIS — I10 Essential (primary) hypertension: Secondary | ICD-10-CM | POA: Insufficient documentation

## 2022-02-17 DIAGNOSIS — I714 Abdominal aortic aneurysm, without rupture, unspecified: Secondary | ICD-10-CM | POA: Insufficient documentation

## 2022-02-17 DIAGNOSIS — K5730 Diverticulosis of large intestine without perforation or abscess without bleeding: Secondary | ICD-10-CM | POA: Diagnosis not present

## 2022-02-17 LAB — CBC WITH DIFFERENTIAL/PLATELET
Abs Immature Granulocytes: 0.03 10*3/uL (ref 0.00–0.07)
Basophils Absolute: 0 10*3/uL (ref 0.0–0.1)
Basophils Relative: 0 %
Eosinophils Absolute: 0.2 10*3/uL (ref 0.0–0.5)
Eosinophils Relative: 2 %
HCT: 37.2 % (ref 36.0–46.0)
Hemoglobin: 12.4 g/dL (ref 12.0–15.0)
Immature Granulocytes: 0 %
Lymphocytes Relative: 31 %
Lymphs Abs: 3 10*3/uL (ref 0.7–4.0)
MCH: 31 pg (ref 26.0–34.0)
MCHC: 33.3 g/dL (ref 30.0–36.0)
MCV: 93 fL (ref 80.0–100.0)
Monocytes Absolute: 0.8 10*3/uL (ref 0.1–1.0)
Monocytes Relative: 8 %
Neutro Abs: 5.7 10*3/uL (ref 1.7–7.7)
Neutrophils Relative %: 59 %
Platelets: 255 10*3/uL (ref 150–400)
RBC: 4 MIL/uL (ref 3.87–5.11)
RDW: 11.9 % (ref 11.5–15.5)
WBC: 9.7 10*3/uL (ref 4.0–10.5)
nRBC: 0 % (ref 0.0–0.2)

## 2022-02-17 LAB — COMPREHENSIVE METABOLIC PANEL
ALT: 17 U/L (ref 0–44)
AST: 19 U/L (ref 15–41)
Albumin: 4.4 g/dL (ref 3.5–5.0)
Alkaline Phosphatase: 59 U/L (ref 38–126)
Anion gap: 11 (ref 5–15)
BUN: 18 mg/dL (ref 8–23)
CO2: 25 mmol/L (ref 22–32)
Calcium: 9.7 mg/dL (ref 8.9–10.3)
Chloride: 97 mmol/L — ABNORMAL LOW (ref 98–111)
Creatinine, Ser: 0.93 mg/dL (ref 0.44–1.00)
GFR, Estimated: >60 mL/min (ref >60)
Glucose, Bld: 99 mg/dL (ref 70–99)
Potassium: 3.3 mmol/L — ABNORMAL LOW (ref 3.5–5.1)
Sodium: 133 mmol/L — ABNORMAL LOW (ref 135–145)
Total Bilirubin: 0.5 mg/dL (ref 0.3–1.2)
Total Protein: 7.4 g/dL (ref 6.5–8.1)

## 2022-02-17 LAB — LIPASE, BLOOD: Lipase: 44 U/L (ref 11–51)

## 2022-02-17 LAB — TROPONIN I (HIGH SENSITIVITY): Troponin I (High Sensitivity): 10 ng/L (ref <18)

## 2022-02-17 MED ORDER — LABETALOL HCL 5 MG/ML IV SOLN: 10.0000 mg | Freq: Once | INTRAVENOUS | Status: DC

## 2022-02-17 MED ORDER — DIPHENHYDRAMINE HCL 50 MG/ML IJ SOLN
50.0000 mg | Freq: Once | INTRAMUSCULAR | Status: AC
  Administered 2022-02-18: 50 mg via INTRAVENOUS
  Filled 2022-02-17: qty 1

## 2022-02-17 MED ORDER — METHYLPREDNISOLONE SODIUM SUCC 40 MG IJ SOLR
40.0000 mg | Freq: Once | INTRAMUSCULAR | Status: AC
  Administered 2022-02-17: 40 mg via INTRAVENOUS
  Filled 2022-02-17: qty 1

## 2022-02-17 MED ORDER — MORPHINE SULFATE (PF) 4 MG/ML IV SOLN
4.0000 mg | Freq: Once | INTRAVENOUS | Status: AC
  Administered 2022-02-17: 4 mg via INTRAVENOUS
  Filled 2022-02-17: qty 1

## 2022-02-17 MED ORDER — DIPHENHYDRAMINE HCL 25 MG PO CAPS
50.0000 mg | ORAL_CAPSULE | Freq: Once | ORAL | Status: AC
  Filled 2022-02-17: qty 2

## 2022-02-17 MED ORDER — NITROGLYCERIN 0.4 MG SL SUBL: 0.4000 mg | SUBLINGUAL_TABLET | SUBLINGUAL | Status: DC | PRN

## 2022-02-17 MED ORDER — FAMOTIDINE IN NACL 20-0.9 MG/50ML-% IV SOLN
20.0000 mg | Freq: Once | INTRAVENOUS | Status: AC
Start: 2022-02-17 — End: 2022-02-17
  Administered 2022-02-17: 20 mg via INTRAVENOUS
  Filled 2022-02-17: qty 50

## 2022-02-17 NOTE — ED Provider Notes (Signed)
[facility] Provider Note    Event Date/Time   First MD Initiated Contact with Patient 02/17/22 2131     (approximate)   History   Chest Pain   HPI  Laura Mcdowell is a 61 y.o. female with past medical history of hypertension, hyperlipidemia who presents with chest and abdominal pain.  Symptoms have been going on for about 1 week.  She endorses pain in the upper abdomen that radiates into the chest.  Has been rather constant.  She is associated shortness of breath.  Pain is not worse with exertion.  She does endorse some burning in her chest after eating.  Saw her primary doctor who started her on Protonix which she thinks may have helped some.  Denies lower extremity swelling.  Patient's blood pressure has been elevated.      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   Hyperlipidemia    Hypertension    PONV (postoperative nausea and vomiting)     Patient Active Problem List   Diagnosis Date Noted   Abdominal aortic aneurysm (AAA) 3.0 cm to 5.0 cm in diameter in female ([place]) 09/05/2021   Vitreous detachment 08/09/2021   Other disorder of bone and cartilage 10/16/2020   Hyperlipidemia 10/16/2020   Microscopic hematuria 09/13/2020   Actinic keratosis 09/13/2020   Hemangioma 04/15/2011   Essential hypertension 05/20/2006     Physical Exam  Triage Vital Signs: ED Triage Vitals  Enc Vitals Group     BP 02/17/22 2133 (!) 197/106     Pulse Rate 02/17/22 2130 81     Resp 02/17/22 2130 (!) 22     Temp --      Temp src --      SpO2 02/17/22 2130 100 %     Weight 02/17/22 2137 184 lb 9.6 oz (83.7 kg)     Height 02/17/22 2137 '5\' 7"'$  (1.702 m)     Head Circumference --      Peak Flow --      Pain Score 02/17/22 2134 8     Pain Loc --      Pain Edu? --      Excl. in [place]? --     Most recent vital signs: Vitals:   02/17/22 2230 02/17/22 2300  BP: (!) 185/102 (!) 143/96  Pulse: 68 62   Resp: 16 14  SpO2: 100% 100%     General: Awake, no distress.  CV:  Good peripheral perfusion.  No lower extremity swelling Resp:  Normal effort.  Lungs are clear Abd:  No distention.  Mild tenderness to palpation in the epigastric region no guarding Neuro:             Awake, Alert, Oriented x 3  Other:     ED Results / Procedures / Treatments  Labs (all labs ordered are listed, but only abnormal results are displayed) Labs Reviewed  COMPREHENSIVE METABOLIC PANEL - Abnormal; Notable for the following components:      Result Value   Sodium 133 (*)    Potassium 3.3 (*)    Chloride 97 (*)    All other components within normal limits  CBC WITH DIFFERENTIAL/PLATELET  LIPASE, BLOOD  TROPONIN I (HIGH SENSITIVITY)  TROPONIN I (HIGH SENSITIVITY)     EKG  EKG reviewed interpreted myself shows normal sinus rhythm with normal axis prolonged QT interval, anterior Q waves, no acute  ischemic changes   RADIOLOGY    PROCEDURES:  Critical Care performed: No  .1-3 Lead EKG Interpretation  Performed by: Rada Hay, MD Authorized by: Rada Hay, MD     Interpretation: normal     ECG rate assessment: normal     Rhythm: sinus rhythm     Ectopy: none     Conduction: normal     The patient is on the cardiac monitor to evaluate for evidence of arrhythmia and/or significant heart rate changes.   MEDICATIONS ORDERED IN ED: Medications  nitroGLYCERIN (NITROSTAT) SL tablet 0.4 mg (has no administration in time range)  diphenhydrAMINE (BENADRYL) capsule 50 mg (has no administration in time range)    Or  diphenhydrAMINE (BENADRYL) injection 50 mg (has no administration in time range)  morphine (PF) 4 MG/ML injection 4 mg (4 mg Intravenous Given 02/17/22 2245)  famotidine (PEPCID) IVPB 20 mg premix (20 mg Intravenous New Bag/Given 02/17/22 2253)  methylPREDNISolone sodium succinate (SOLU-MEDROL) 40 mg/mL injection 40 mg (40 mg Intravenous Given 02/17/22 2245)      IMPRESSION / MDM / ASSESSMENT AND PLAN / ED COURSE  I reviewed the triage vital signs and the nursing notes.                              Patient's presentation is most consistent with acute presentation with potential threat to life or bodily function.  Differential diagnosis includes, but is not limited to, acute coronary syndrome, aortic dissection, pulmonary embolism, GERD/gastritis, peptic ulcer, pancreatitis  Patient is a old female history of hypertension presenting with chest and upper abdominal pain for about 1 week.  She has some associated shortness of breath.  Was recently prescribed pantoprazole for presumed reflux which has been taking.  Presents tonight because of worsening pain.  She looks comfortable on my evaluation.  She is quite hypertensive on arrival.  Abdomen is mildly tender in the epigastric region.  EKG overall looks similar to prior without acute ischemic changes.  First troponin is negative.  Initially I had ordered some labetalol for her blood pressure but it improved prior to intervention.  Patient given morphine for pain.  Patient does have history of AAA and with her chest and back pain and hypertension I am concerned about potential aortic dissection.  I have ordered a CT angio but patient unfortunately has a contrast allergy.  Will require pretreatment.  I have signed this out to the oncoming provider.  If patient feels improved with negative troponin and reassuring CT imaging I think she would be appropriate for outpatient follow-up.   Clinical Course as of 02/17/22 2337  Tue Feb 17, 2022  2321 Upon reassessment, the patient's blood pressure is 143/96.  Given that substantial improvement without treatment, the risk of treating unnecessarily outweighs the benefit and we will hold off on antihypertensive treatment at this time.  Canceling the order for labetalol. [CF]    Clinical Course User Index [CF] Hinda Kehr, MD     FINAL CLINICAL IMPRESSION(S) /  ED DIAGNOSES   Final diagnoses:  Chest pain, unspecified type     Rx / DC Orders   ED Discharge Orders     None        Note:  This document was prepared using Dragon voice recognition software and may include unintentional dictation errors.   Rada Hay, MD 02/17/22 2337

## 2022-02-17 NOTE — ED Provider Notes (Signed)
-----------------------------------------   11:16 PM on 02/17/2022 -----------------------------------------  Assuming care from Dr. Starleen Blue.  In short, Laura Mcdowell is a 61 y.o. female with a chief complaint of epigastric and possibly chest pain.  Refer to the original H&P for additional details.  The current plan of care is to do an emergency preparation for IV contrast dye allergy and then proceed with CTA chest/abdomen/pelvis for dissection rule out.  Although the patient is most likely asymptomatic from her essential hypertension, the possibility of dissection with known aneurysm encourages treatment; we are treating with labetalol 10 mg IV at this time, given that the patient's heart rate is in the upper 60s currently.   Clinical Course as of 02/18/22 0330  Tue Feb 17, 2022  2321 Upon reassessment, the patient's blood pressure is 143/96.  Given that substantial improvement without treatment, the risk of treating unnecessarily outweighs the benefit and we will hold off on antihypertensive treatment at this time.  Canceling the order for labetalol. [CF]  Wed Feb 18, 2022  0329 CT Angio Chest/Abd/Pel for Dissection W and/or Wo Contrast I viewed and interpreted the patient's CTA chest/abdomen/pelvis.  I see no evidence of dissection or other obvious acute or emergent abnormality.  The radiologist commented on a number of incidental findings including the presence of her known and stable AAA as well as some benign-appearing renal cysts, but there is nothing to explain her pain which is reassuring.  I updated the patient.  Her blood pressure is extremely variable while in the emergency department.  She has a prescription for metoprolol that she has chosen not to take at home.  She has a follow-up appointment in the cardiology clinic this morning.  I encouraged her to keep that appointment, take all of her medications as prescribed, and follow-up with her regular doctor as well as with a cardiologist.   I gave my usual and customary return precautions. [CF]    Clinical Course User Index [CF] Hinda Kehr, MD     Medications  nitroGLYCERIN (NITROSTAT) SL tablet 0.4 mg (has no administration in time range)  morphine (PF) 4 MG/ML injection 4 mg (4 mg Intravenous Given 02/17/22 2245)  famotidine (PEPCID) IVPB 20 mg premix (0 mg Intravenous Stopped 02/17/22 2341)  methylPREDNISolone sodium succinate (SOLU-MEDROL) 40 mg/mL injection 40 mg (40 mg Intravenous Given 02/17/22 2245)  diphenhydrAMINE (BENADRYL) capsule 50 mg ( Oral See Alternative 02/18/22 0145)    Or  diphenhydrAMINE (BENADRYL) injection 50 mg (50 mg Intravenous Given 02/18/22 0145)  iohexol (OMNIPAQUE) 350 MG/ML injection 100 mL (100 mLs Intravenous Contrast Given 02/18/22 0247)     ED Discharge Orders     None      Final diagnoses:  Chest pain, unspecified type  Essential hypertension  Abdominal aortic aneurysm (AAA) without rupture, unspecified part ([place])     Hinda Kehr, MD 02/18/22 0330

## 2022-02-17 NOTE — ED Triage Notes (Signed)
Pt has had numbness all over intermittently with activity. Chest pain in right breast a week, epigastric pain for a week, and abd pain for a week.

## 2022-02-18 ENCOUNTER — Emergency Department: Payer: 59

## 2022-02-18 DIAGNOSIS — R079 Chest pain, unspecified: Secondary | ICD-10-CM | POA: Diagnosis not present

## 2022-02-18 DIAGNOSIS — K5730 Diverticulosis of large intestine without perforation or abscess without bleeding: Secondary | ICD-10-CM | POA: Diagnosis not present

## 2022-02-18 LAB — TROPONIN I (HIGH SENSITIVITY): Troponin I (High Sensitivity): 14 ng/L (ref <18)

## 2022-02-18 MED ORDER — IOHEXOL 350 MG/ML SOLN
100.0000 mL | Freq: Once | INTRAVENOUS | Status: AC | PRN
Start: 2022-02-18 — End: 2022-02-18
  Administered 2022-02-18: 100 mL via INTRAVENOUS

## 2022-02-18 NOTE — Discharge Instructions (Addendum)
Your workup in the Emergency Department today was reassuring.  We did not find any specific abnormalities.  We recommend you drink plenty of fluids, take your regular medications and/or any new ones prescribed today, and follow up with the doctor(s) listed in these documents as recommended.  Return to the Emergency Department if you develop new or worsening symptoms that concern you.  

## 2022-02-25 DIAGNOSIS — I10 Essential (primary) hypertension: Secondary | ICD-10-CM | POA: Diagnosis not present

## 2022-02-25 DIAGNOSIS — F411 Generalized anxiety disorder: Secondary | ICD-10-CM | POA: Diagnosis not present

## 2022-02-25 DIAGNOSIS — E876 Hypokalemia: Secondary | ICD-10-CM | POA: Diagnosis not present

## 2022-02-25 DIAGNOSIS — R109 Unspecified abdominal pain: Secondary | ICD-10-CM | POA: Diagnosis not present

## 2022-02-25 DIAGNOSIS — G629 Polyneuropathy, unspecified: Secondary | ICD-10-CM | POA: Diagnosis not present

## 2022-02-25 DIAGNOSIS — R69 Illness, unspecified: Secondary | ICD-10-CM | POA: Diagnosis not present

## 2022-03-03 DIAGNOSIS — R69 Illness, unspecified: Secondary | ICD-10-CM | POA: Diagnosis not present

## 2022-03-03 DIAGNOSIS — I10 Essential (primary) hypertension: Secondary | ICD-10-CM | POA: Diagnosis not present

## 2022-03-03 DIAGNOSIS — E782 Mixed hyperlipidemia: Secondary | ICD-10-CM | POA: Diagnosis not present

## 2022-03-03 DIAGNOSIS — E876 Hypokalemia: Secondary | ICD-10-CM | POA: Diagnosis not present

## 2022-03-04 DIAGNOSIS — E876 Hypokalemia: Secondary | ICD-10-CM | POA: Diagnosis not present

## 2022-04-06 ENCOUNTER — Encounter (INDEPENDENT_AMBULATORY_CARE_PROVIDER_SITE_OTHER): Payer: Self-pay

## 2022-04-17 ENCOUNTER — Ambulatory Visit (INDEPENDENT_AMBULATORY_CARE_PROVIDER_SITE_OTHER): Payer: 59 | Admitting: Vascular Surgery

## 2022-04-17 ENCOUNTER — Other Ambulatory Visit (INDEPENDENT_AMBULATORY_CARE_PROVIDER_SITE_OTHER): Payer: 59

## 2022-05-05 DIAGNOSIS — L570 Actinic keratosis: Secondary | ICD-10-CM | POA: Diagnosis not present

## 2022-05-05 DIAGNOSIS — X32XXXA Exposure to sunlight, initial encounter: Secondary | ICD-10-CM | POA: Diagnosis not present

## 2022-05-05 DIAGNOSIS — D2262 Melanocytic nevi of left upper limb, including shoulder: Secondary | ICD-10-CM | POA: Diagnosis not present

## 2022-05-05 DIAGNOSIS — Z85828 Personal history of other malignant neoplasm of skin: Secondary | ICD-10-CM | POA: Diagnosis not present

## 2022-05-05 DIAGNOSIS — D2272 Melanocytic nevi of left lower limb, including hip: Secondary | ICD-10-CM | POA: Diagnosis not present

## 2022-05-05 DIAGNOSIS — D2261 Melanocytic nevi of right upper limb, including shoulder: Secondary | ICD-10-CM | POA: Diagnosis not present

## 2022-05-06 ENCOUNTER — Other Ambulatory Visit (HOSPITAL_COMMUNITY)
Admission: RE | Admit: 2022-05-06 | Discharge: 2022-05-06 | Disposition: A | Discharge status: Home / Self Care | Payer: 59 | Attending: Obstetrics and Gynecology | Admitting: Obstetrics and Gynecology

## 2022-05-06 ENCOUNTER — Ambulatory Visit: Payer: 59 | Admitting: Obstetrics and Gynecology

## 2022-05-06 ENCOUNTER — Encounter: Payer: Self-pay | Admitting: Obstetrics and Gynecology

## 2022-05-06 VITALS — BP 196/111 | HR 82 | Ht 65.5 in | Wt 177.0 lb

## 2022-05-06 DIAGNOSIS — N3281 Overactive bladder: Secondary | ICD-10-CM

## 2022-05-06 DIAGNOSIS — N816 Rectocele: Secondary | ICD-10-CM | POA: Diagnosis not present

## 2022-05-06 DIAGNOSIS — N812 Incomplete uterovaginal prolapse: Secondary | ICD-10-CM

## 2022-05-06 DIAGNOSIS — N8110 Cystocele, unspecified: Secondary | ICD-10-CM

## 2022-05-06 DIAGNOSIS — R319 Hematuria, unspecified: Secondary | ICD-10-CM

## 2022-05-06 DIAGNOSIS — R350 Frequency of micturition: Secondary | ICD-10-CM

## 2022-05-06 DIAGNOSIS — N393 Stress incontinence (female) (male): Secondary | ICD-10-CM

## 2022-05-06 LAB — URINALYSIS, ROUTINE W REFLEX MICROSCOPIC
Bilirubin Urine: NEGATIVE
Glucose, UA: NEGATIVE mg/dL
Ketones, ur: NEGATIVE mg/dL
Leukocytes,Ua: NEGATIVE
Nitrite: NEGATIVE
Protein, ur: NEGATIVE mg/dL
Specific Gravity, Urine: 1.004 — ABNORMAL LOW (ref 1.005–1.030)
pH: 6 (ref 5.0–8.0)

## 2022-05-06 NOTE — Progress Notes (Unsigned)
[facility] New Patient Evaluation and Consultation  Referring Provider: Mechele Claude, FNP PCP: Mechele Claude, FNP Date of Service: 05/06/2022  SUBJECTIVE Chief Complaint: New Patient (Initial Visit)  History of Present Illness: Laura Mcdowell is a 61 y.o. {ED SANE [phone] female seen in consultation at the request of Dr. Enid Derry for evaluation of ***.    ***Review of records significant for: ***  Urinary Symptoms: Leaks urine with lifting, going from sitting to standing, with a full bladder, and with urgency Leaks usually if she holds her bladder for too long.  Pad use:  several  pads per day.   She is bothered by her UI symptoms.  Day time voids- every 1-2 hours.  Nocturia: every 2.5-3 hrs at night to void. Voiding dysfunction: she does not empty her bladder well.  does not use a catheter to empty bladder.  When urinating, she feels a weak stream, difficulty starting urine stream, dribbling after finishing, the need to urinate multiple times in a row, and to push on her belly or vagina to empty bladder Drinks: 1-1.5 cups coffee, juice, 5 bottles water per day. No longer drinks soda. Drinks up until bedtime.   UTIs:  0  UTI's in the last year.   Denies history of blood in urine and kidney or bladder stones  Pelvic Organ Prolapse Symptoms:                  She Admits to a feeling of a bulge the vaginal area. It has been present since 1993. She Admits to seeing a bulge.  This bulge is bothersome.  Bowel Symptom: Bowel movements: 1 time(s) per day Stool consistency: soft  Straining: no.  Splinting: no.  Incomplete evacuation: no.  She Denies accidental bowel leakage / fecal incontinence Bowel regimen: diet, fiber, and stool softener  Sexual Function Sexually active: yes.  Sexual orientation: Straight Pain with sex: No  Pelvic Pain Admits to pelvic pain.  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   Hyperlipidemia    Hypertension    PONV (postoperative nausea and vomiting)      Past Surgical History:   Past Surgical History:  Procedure Laterality Date   APPENDECTOMY     COLONOSCOPY WITH PROPOFOL N/A 09/01/2021   Procedure: COLONOSCOPY WITH PROPOFOL;  Surgeon: Lin Landsman, MD;  Location: [facility] ENDOSCOPY;  Service: Gastroenterology;  Laterality: N/A;  Wants 1st per office   ECTOPIC PREGNANCY SURGERY  1995   MOHS SURGERY     NECK SURGERY     TONSILLECTOMY       Past OB/GYN History: OB History  Gravida Para Term Preterm AB Living  '2 1 1   1 1  '$ SAB IAB Ectopic Multiple Live Births      1   1    # Outcome Date GA Lbr Len/2nd Weight Sex Delivery Anes PTL Lv  2 Term      Vag-Vacuum     1 Ectopic            Menopausal: {menopausal:24763} Contraception: ***. Last pap smear was ***.  Any history of abnormal pap smears: {yes/no:19897}.   Medications: She has a current medication list which includes the following prescription(s): alprazolam, cholecalciferol, losartan, and spironolactone.   Allergies: Patient is allergic to penicillins, ivp dye [  iodinated contrast media], phenergan [promethazine], and prochlorperazine.   Social History:  Social History   Tobacco Use   Smoking status: Former    Types: Cigarettes   Smokeless tobacco: Never  Vaping Use   Vaping Use: Never used  Substance Use Topics   Alcohol use: Never   Drug use: Never    Relationship status: {relationship status:24764} She lives with ***.   She {ACTION; IS/IS RWE:31540086} employed ***. Regular exercise: {Yes/No:304960894} History of abuse: {Yes/No:304960894}  Family History:   Family History  Problem Relation Age of Onset   Osteoporosis Mother    Intracerebral hemorrhage Father    Melanoma Brother    AAA (abdominal aortic aneurysm) Maternal Grandmother      Review  of Systems: Review of Systems  Constitutional:  Positive for malaise/fatigue. Negative for fever and weight loss.  Respiratory:  Negative for cough, shortness of breath and wheezing.   Cardiovascular:  Negative for chest pain, palpitations and leg swelling.  Gastrointestinal:  Negative for abdominal pain and blood in stool.  Genitourinary:  Negative for dysuria.  Musculoskeletal:  Positive for myalgias.  Skin:  Negative for rash.  Neurological:  Positive for dizziness and headaches.  Endo/Heme/Allergies:  Bruises/bleeds easily.       + hot flashes  Psychiatric/Behavioral:  Negative for depression. The patient is nervous/anxious.      OBJECTIVE Physical Exam: Vitals:   05/06/22 0831  BP: (!) 196/111  Pulse: 82  Weight: 177 lb (80.3 kg)  Height: 5' 5.5" (1.664 m)    Physical Exam   GU / Detailed Urogynecologic Evaluation:  Pelvic Exam: Normal external female genitalia; Bartholin's and Skene's glands normal in appearance; urethral meatus normal in appearance, no urethral masses or discharge.   CST: {gen negative/positive:315881}  Reflexes: bulbocavernosis {DESC; PRESENT/NOT PRESENT:21021351}, anocutaneous {DESC; PRESENT/NOT PRESENT:21021351} ***bilaterally.  Speculum exam reveals normal vaginal mucosa {With/Without:20273} atrophy. Cervix {exam; gyn cervix:30847}. Uterus {exam; pelvic uterus:30849}. Adnexa {exam; adnexa:12223}.    s/p hysterectomy: Speculum exam reveals normal vaginal mucosa {With/Without:20273}  atrophy and normal vaginal cuff.  Adnexa {exam; adnexa:12223}.    With apex supported, anterior compartment defect was {reduced:24765}  Pelvic floor strength {Roman # I-V:19040}/V, puborectalis {Roman # I-V:19040}/V external anal sphincter {Roman # I-V:19040}/V  Pelvic floor musculature: Right levator {Tender/Non-tender:20250}, Right obturator {Tender/Non-tender:20250}, Left levator {Tender/Non-tender:20250}, Left obturator {Tender/Non-tender:20250}  POP-Q:    POP-Q                                               Aa                                               Ba                                                 C                                                Gh  Pb                                               tvl                                                Ap                                               Bp                                                 D      Rectal Exam:  Normal sphincter tone, {rectocele:24766} distal rectocele, enterocoele {DESC; PRESENT/NOT PRESENT:21021351}, no rectal masses, {sign of:24767} dyssynergia when asking the patient to bear down.  Post-Void Residual (PVR) by Bladder Scan: In order to evaluate bladder emptying, we discussed obtaining a postvoid residual and she agreed to this procedure.  Procedure: The ultrasound unit was placed on the patient's abdomen in the suprapubic region after the patient had voided. A PVR of 7 ml was obtained by bladder scan.  Laboratory Results: POC urine: small blood, otherwise negative   ASSESSMENT AND PLAN Ms. Blansett is a 61 y.o. with:  1. Hematuria, unspecified type       Jaquita Folds, MD   Medical Decision Making:  - Reviewed/ ordered a clinical laboratory test - Reviewed/ ordered a radiologic study - Reviewed/ ordered medicine test - Decision to obtain old records - Discussion of management of or test interpretation with an external physician / other healthcare professional  - Assessment requiring independent historian - Review and summation of prior records - Independent review of image, tracing or specimen

## 2022-05-06 NOTE — Patient Instructions (Signed)
You have a stage 3 (out of 4) prolapse.  We discussed the fact that it is not life threatening but there are several treatment options. For treatment of pelvic organ prolapse, we discussed options for management including expectant management, conservative management, and surgical management, such as Kegels, a pessary, pelvic floor physical therapy, and specific surgical procedures.     A referral was placed to:  Urogynecology and Reconstructive Pelvic Surgery at [facility] [address] [place], [place] [address]  Main Phone: [phone]

## 2022-05-07 LAB — POCT URINALYSIS DIPSTICK
Bilirubin, UA: NEGATIVE
Glucose, UA: NEGATIVE
Ketones, UA: NEGATIVE
Leukocytes, UA: NEGATIVE
Nitrite, UA: NEGATIVE
Protein, UA: NEGATIVE
Spec Grav, UA: <=1.005 — AB (ref 1.010–1.025)
Urobilinogen, UA: 0.2 E.U./dL
pH, UA: 6 (ref 5.0–8.0)

## 2022-05-07 LAB — URINE CULTURE: Culture: <10000 — AB

## 2022-05-07 NOTE — Progress Notes (Addendum)
 Urogynecology   Subjective:     Chief Complaint: Pessary fitting  History of Present Illness: Laura Mcdowell is a 61 y.o. female with stage III pelvic organ prolapse who presents today for a pessary fitting.    Past Medical History: Patient  has a past medical history of Abdominal aortic aneurysm (AAA) ([place]), Arthritis, Cancer ([place]), Diverticulosis, Hyperlipidemia, Hypertension, and PONV (postoperative nausea and vomiting).   Past Surgical History: She  has a past surgical history that includes Colonoscopy with propofol (N/A, 09/01/2021); Tonsillectomy; Appendectomy; Neck surgery; Mohs surgery; and Ectopic pregnancy surgery (1995).   Medications: She has a current medication list which includes the following prescription(s): conjugated estrogens, alprazolam, cholecalciferol, losartan, and spironolactone.   Allergies: Patient is allergic to penicillins, ivp dye [iodinated contrast media], phenergan [promethazine], and prochlorperazine.   Social History: Patient  reports that she has quit smoking. Her smoking use included cigarettes. She has never used smokeless tobacco. She reports that she does not drink alcohol and does not use drugs.      Objective:    BP (!) 184/125   Pulse 70  Gen: No apparent distress, A&O x 3. Pelvic Exam: Normal external female genitalia; Bartholin's and Skene's glands normal in appearance; urethral meatus normal in appearance, no urethral masses or discharge.   Attempted a size #4 incontinence dish with support  Attempted a size # 5 cube  A size #6 (3inch) short stem gellhorn pessary (Lot [id number]) was fitted. It was comfortable, stayed in place with valsalva and was an appropriate size on examination, with one finger fitting between the pessary and the vaginal walls.She was able to take it out and put it back in in the office. She was able to urinate and simulate a bowel movement without the pessary coming out. Patient reports she feels as if  the pessary is keeping everything up and it is comfortable to her.    Assessment/Plan:    Assessment: Ms. Laura Mcdowell is a 61 y.o. with stage III pelvic organ prolapse who presents for a pessary fitting. Plan: She was fitted with a #6 short stem gellhorn pessary. She will remove once a week to clean . She will use estrogen.   She will use the estrogen daily for two weeks and then twice weekly for her vaginal atrophy.   Follow-up as needed for. She is having an insurance change in January. She will be establishing care at [facility] due to the insurance change.   Of note patient had an elevated blood pressure reading today in the office x2. She reports she will go home and take her BP medication and that she and her primary are aware of her elevated BP readings. 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      Berton Mount, NP

## 2022-05-08 ENCOUNTER — Other Ambulatory Visit (INDEPENDENT_AMBULATORY_CARE_PROVIDER_SITE_OTHER): Payer: Self-pay | Admitting: Nurse Practitioner

## 2022-05-08 ENCOUNTER — Ambulatory Visit (INDEPENDENT_AMBULATORY_CARE_PROVIDER_SITE_OTHER): Payer: 59

## 2022-05-08 DIAGNOSIS — I714 Abdominal aortic aneurysm, without rupture, unspecified: Secondary | ICD-10-CM | POA: Diagnosis not present

## 2022-05-11 ENCOUNTER — Ambulatory Visit: Payer: 59 | Admitting: Obstetrics and Gynecology

## 2022-05-11 ENCOUNTER — Encounter: Payer: Self-pay | Admitting: Obstetrics and Gynecology

## 2022-05-11 VITALS — BP 184/125 | HR 70

## 2022-05-11 DIAGNOSIS — N816 Rectocele: Secondary | ICD-10-CM | POA: Diagnosis not present

## 2022-05-11 DIAGNOSIS — I10 Essential (primary) hypertension: Secondary | ICD-10-CM

## 2022-05-11 DIAGNOSIS — N952 Postmenopausal atrophic vaginitis: Secondary | ICD-10-CM

## 2022-05-11 DIAGNOSIS — N8110 Cystocele, unspecified: Secondary | ICD-10-CM

## 2022-05-11 DIAGNOSIS — N812 Incomplete uterovaginal prolapse: Secondary | ICD-10-CM | POA: Diagnosis not present

## 2022-05-11 MED ORDER — ESTROGENS CONJUGATED 0.625 MG/GM VA CREA: 1.0000 | TOPICAL_CREAM | VAGINAL | 5 refills | Status: DC

## 2022-05-11 MED ORDER — ESTRADIOL 0.1 MG/GM VA CREA: 0.5000 g | TOPICAL_CREAM | VAGINAL | 11 refills | Status: DC

## 2022-05-11 NOTE — Addendum Note (Signed)
Addended by: Berton Mount on: 05/11/2022 03:36 PM   Modules accepted: Orders

## 2022-05-11 NOTE — Patient Instructions (Signed)
Use the estrogen cream nightly x2 weeks and then  twice weekly.

## 2022-06-05 DIAGNOSIS — R002 Palpitations: Secondary | ICD-10-CM | POA: Diagnosis not present

## 2022-06-05 DIAGNOSIS — I10 Essential (primary) hypertension: Secondary | ICD-10-CM | POA: Diagnosis not present

## 2022-06-05 DIAGNOSIS — I872 Venous insufficiency (chronic) (peripheral): Secondary | ICD-10-CM | POA: Diagnosis not present

## 2022-06-05 DIAGNOSIS — R319 Hematuria, unspecified: Secondary | ICD-10-CM | POA: Diagnosis not present

## 2022-06-05 DIAGNOSIS — R7303 Prediabetes: Secondary | ICD-10-CM | POA: Diagnosis not present

## 2022-06-05 DIAGNOSIS — R69 Illness, unspecified: Secondary | ICD-10-CM | POA: Diagnosis not present

## 2022-06-05 DIAGNOSIS — E782 Mixed hyperlipidemia: Secondary | ICD-10-CM | POA: Diagnosis not present

## 2022-06-09 ENCOUNTER — Encounter (INDEPENDENT_AMBULATORY_CARE_PROVIDER_SITE_OTHER): Payer: Self-pay

## 2022-06-09 ENCOUNTER — Other Ambulatory Visit (INDEPENDENT_AMBULATORY_CARE_PROVIDER_SITE_OTHER): Payer: 59

## 2022-06-09 ENCOUNTER — Ambulatory Visit (INDEPENDENT_AMBULATORY_CARE_PROVIDER_SITE_OTHER): Payer: 59 | Admitting: Vascular Surgery

## 2022-08-24 ENCOUNTER — Other Ambulatory Visit: Payer: Self-pay | Admitting: Family

## 2022-08-24 DIAGNOSIS — I10 Essential (primary) hypertension: Secondary | ICD-10-CM

## 2022-10-09 ENCOUNTER — Other Ambulatory Visit: Payer: Self-pay | Admitting: Family

## 2022-12-04 ENCOUNTER — Other Ambulatory Visit: Payer: Self-pay | Admitting: Family

## 2022-12-04 DIAGNOSIS — F410 Panic disorder [episodic paroxysmal anxiety] without agoraphobia: Secondary | ICD-10-CM

## 2023-01-06 ENCOUNTER — Other Ambulatory Visit: Payer: Self-pay

## 2023-01-06 DIAGNOSIS — I10 Essential (primary) hypertension: Secondary | ICD-10-CM

## 2023-01-06 MED ORDER — LOSARTAN POTASSIUM 100 MG PO TABS
100.0000 mg | ORAL_TABLET | Freq: Every day | ORAL | 2 refills | Status: DC
Start: 2023-01-06 — End: 2024-02-17

## 2023-02-08 ENCOUNTER — Other Ambulatory Visit: Payer: Self-pay | Admitting: Family

## 2023-03-06 IMAGING — MG MM DIGITAL SCREENING BILAT W/ TOMO AND CAD
8 series · 9 of 24 positions shown · non-contrast
Comparison: Previous exam(s).

CLINICAL DATA: Screening.

EXAM:
DIGITAL SCREENING BILATERAL MAMMOGRAM WITH TOMOSYNTHESIS AND CAD
TECHNIQUE: Bilateral screening digital craniocaudal and mediolateral oblique
mammograms were obtained. Bilateral screening digital breast
tomosynthesis was performed. The images were evaluated with
computer-aided detection.

[L MLO synth-2D]
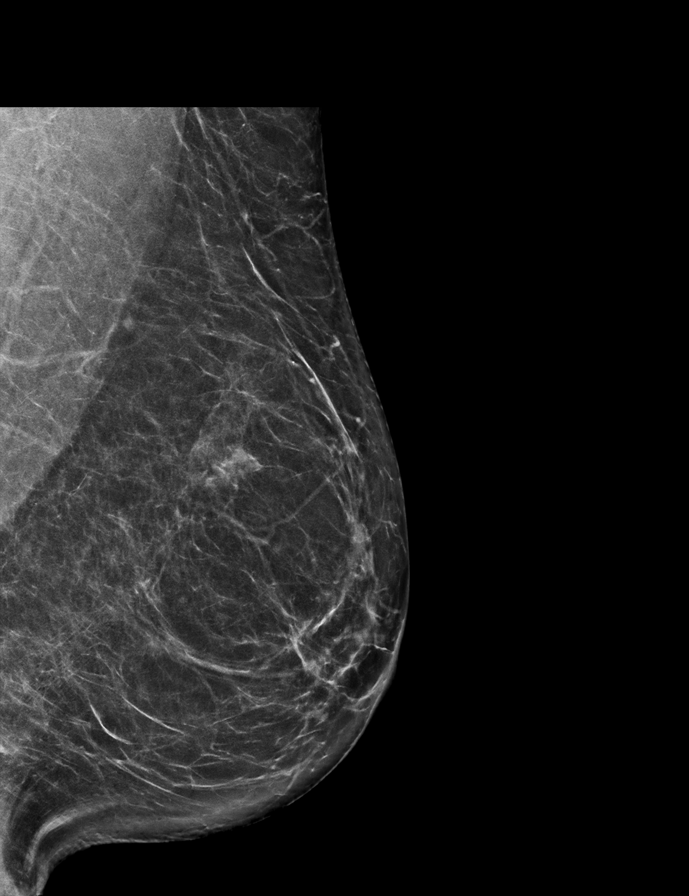

[L CC synth-2D]
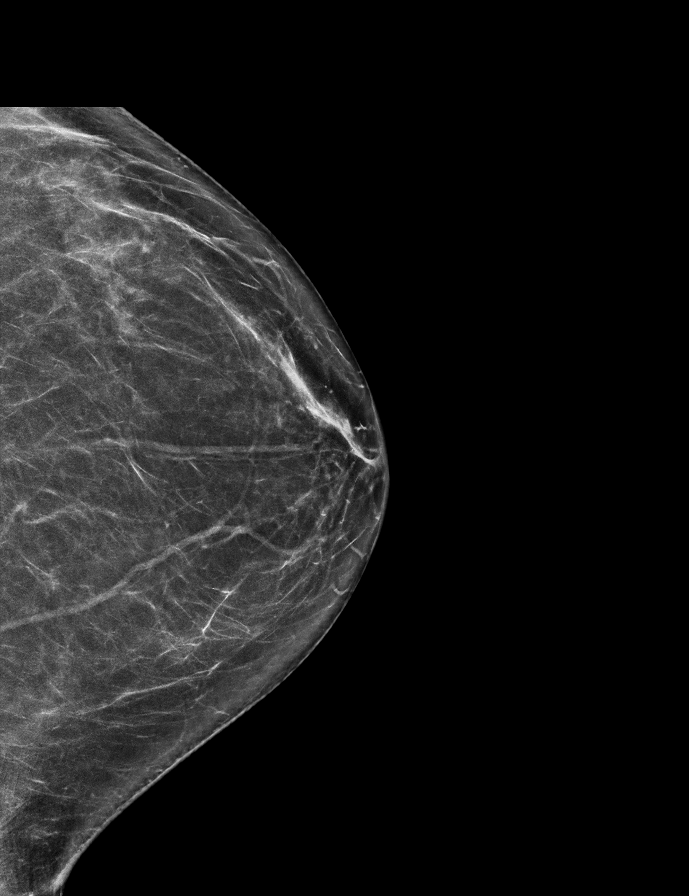

[R CC synth-2D]
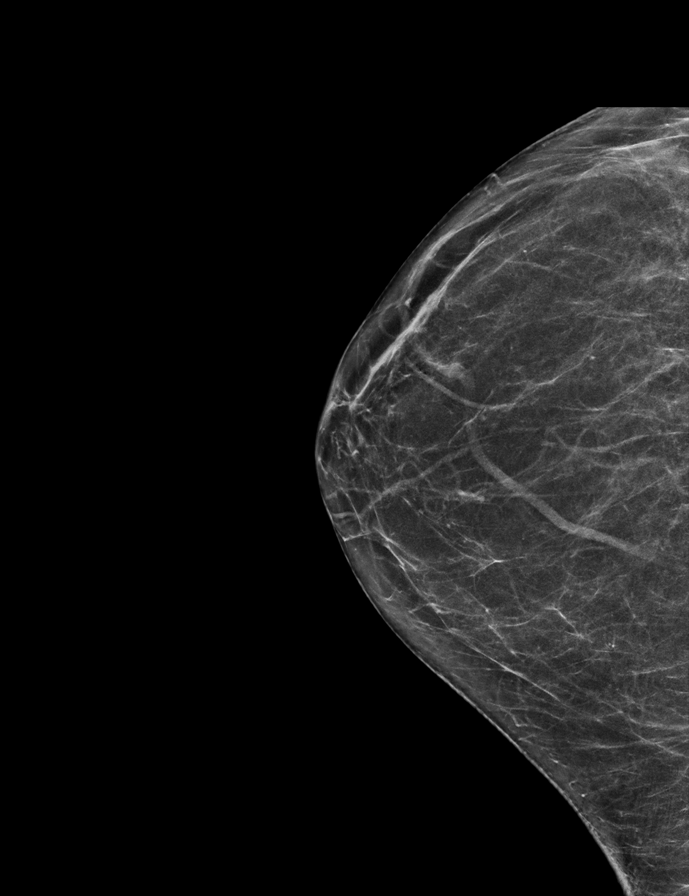

[R MLO synth-2D]
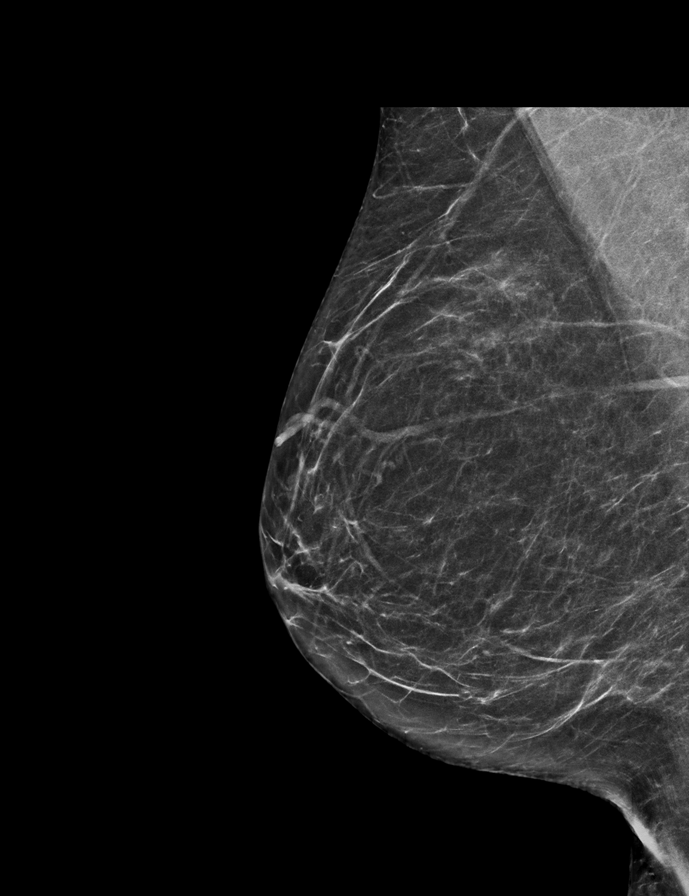

[L MLO tomo · 2 of 69 frames shown]
[frame 23/69]
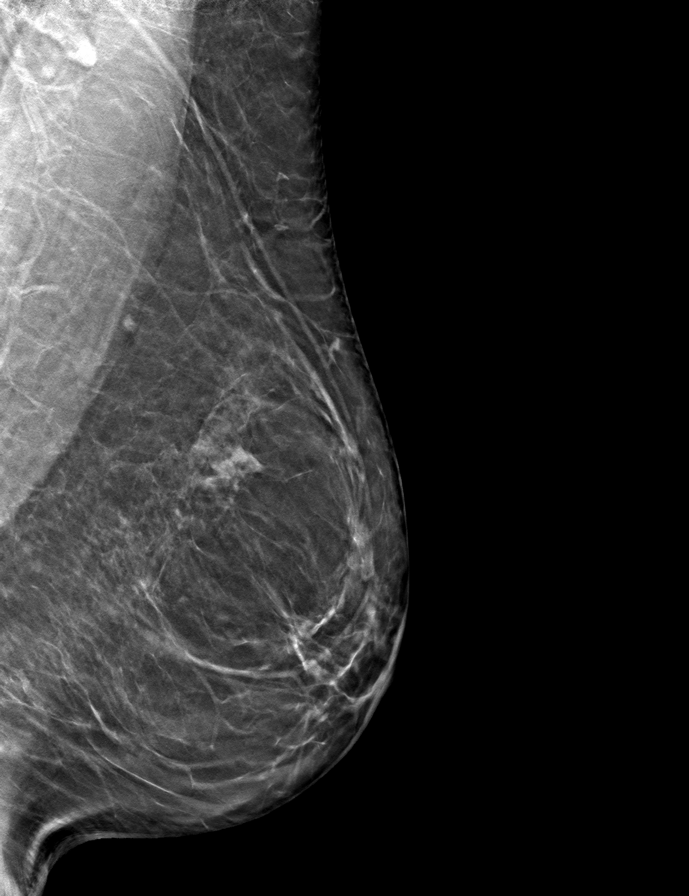
[frame 35/69]
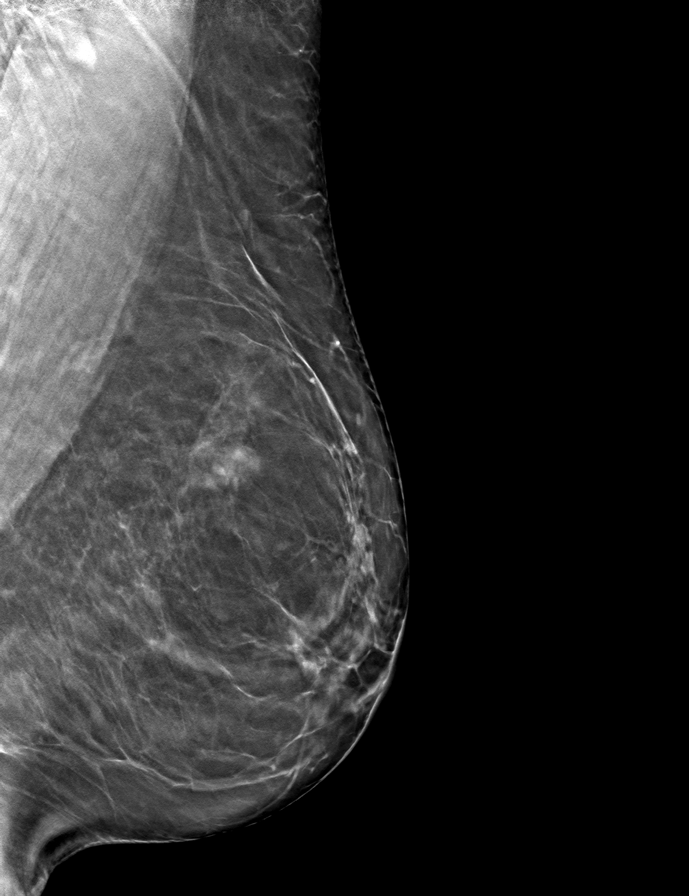

[R MLO tomo · tomo slice 32/63.0]
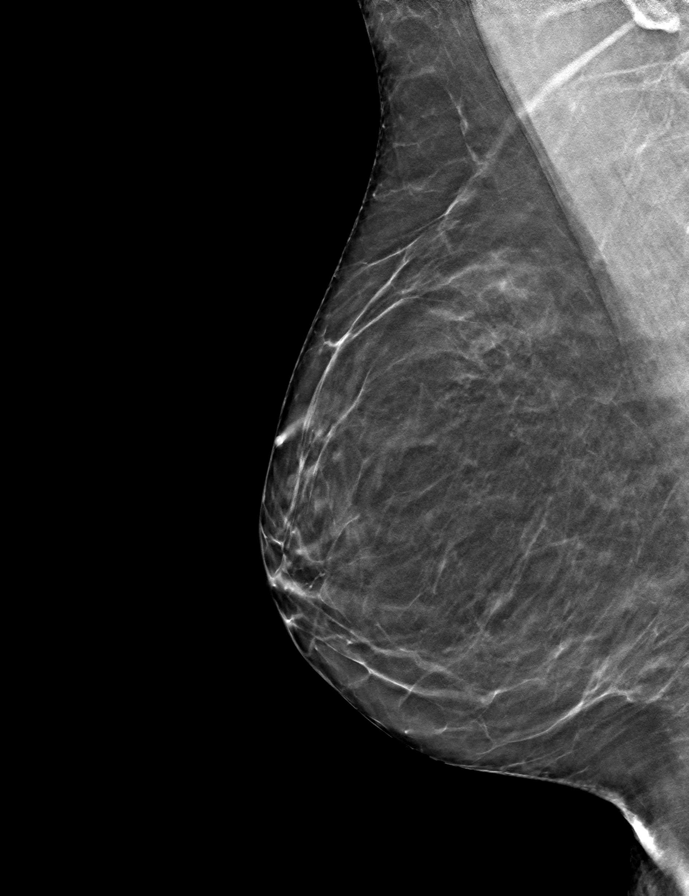

[L CC tomo · tomo slice 35/68.0]
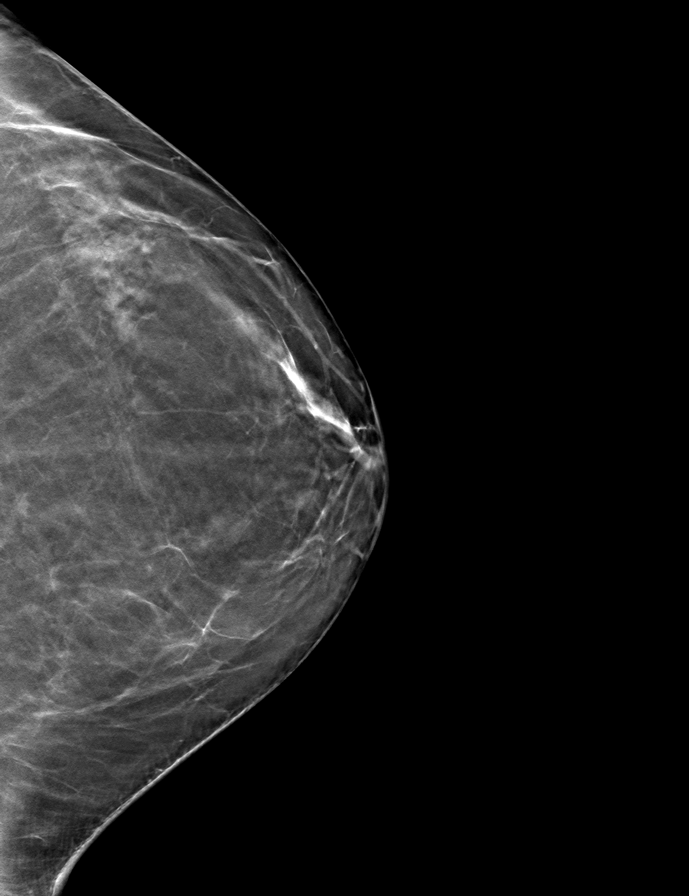

[R CC tomo · tomo slice 31/60.0]
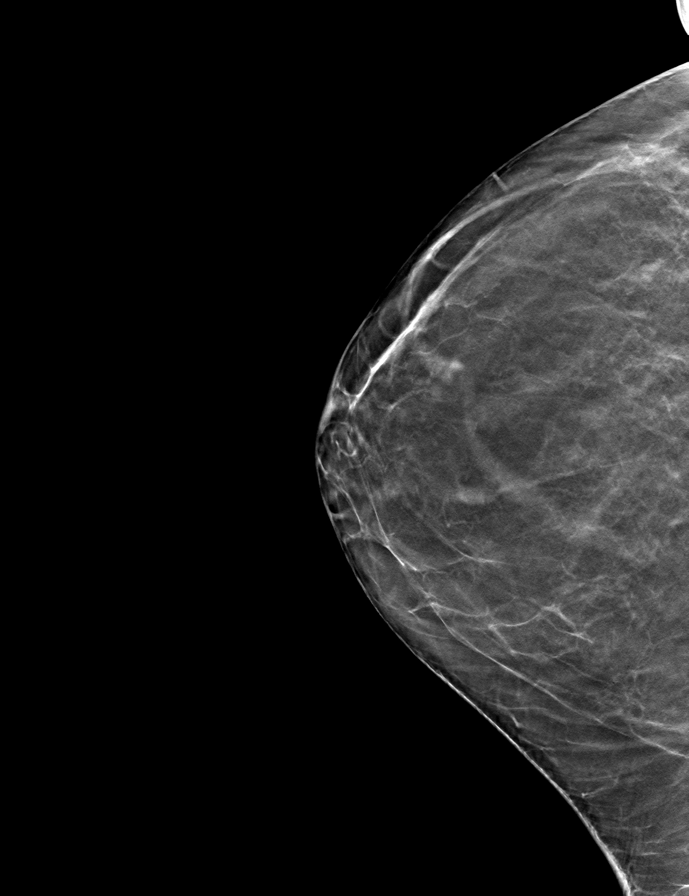

[9 of 24 positions shown; findings below may reference images not displayed]

ACR Breast Density Category b: There are scattered areas of
fibroglandular density.
FINDINGS: There are no findings suspicious for malignancy.
IMPRESSION: No mammographic evidence of malignancy. A result letter of this
screening mammogram will be mailed directly to the patient.

RECOMMENDATION:
Screening mammogram in one year. (Code:51-O-LD2)

BI-RADS CATEGORY  1: Negative.

## 2023-04-21 ENCOUNTER — Other Ambulatory Visit: Payer: Self-pay | Admitting: Obstetrics and Gynecology

## 2023-04-21 DIAGNOSIS — N952 Postmenopausal atrophic vaginitis: Secondary | ICD-10-CM

## 2023-05-09 ENCOUNTER — Other Ambulatory Visit: Payer: Self-pay | Admitting: Family

## 2023-05-09 DIAGNOSIS — I10 Essential (primary) hypertension: Secondary | ICD-10-CM

## 2024-02-17 ENCOUNTER — Other Ambulatory Visit: Payer: Self-pay | Admitting: Family

## 2024-02-17 DIAGNOSIS — I10 Essential (primary) hypertension: Secondary | ICD-10-CM
